# Patient Record
Sex: Female | Born: 1960 | Race: White | Hispanic: No | Marital: Married | Smoking: Current every day smoker
Health system: Southern US, Community
[De-identification: ages and names within clinical notes are randomized; demographics above are authoritative.]

## PROBLEM LIST (undated history)

## (undated) DIAGNOSIS — G8929 Other chronic pain: Secondary | ICD-10-CM

## (undated) DIAGNOSIS — M1990 Unspecified osteoarthritis, unspecified site: Secondary | ICD-10-CM

## (undated) DIAGNOSIS — K759 Inflammatory liver disease, unspecified: Secondary | ICD-10-CM

## (undated) DIAGNOSIS — F419 Anxiety disorder, unspecified: Secondary | ICD-10-CM

---

## 2007-11-14 ENCOUNTER — Ambulatory Visit: Payer: Self-pay | Admitting: Unknown Physician Specialty

## 2010-01-07 ENCOUNTER — Ambulatory Visit: Payer: Self-pay | Admitting: Unknown Physician Specialty

## 2011-02-23 ENCOUNTER — Ambulatory Visit: Payer: Self-pay | Admitting: Family Medicine

## 2011-03-30 ENCOUNTER — Ambulatory Visit: Payer: Self-pay | Admitting: Family Medicine

## 2011-12-04 ENCOUNTER — Ambulatory Visit: Payer: Self-pay | Admitting: Internal Medicine

## 2012-02-29 ENCOUNTER — Ambulatory Visit: Payer: Self-pay | Admitting: Unknown Physician Specialty

## 2012-03-06 ENCOUNTER — Ambulatory Visit: Payer: Self-pay | Admitting: Unknown Physician Specialty

## 2013-03-13 ENCOUNTER — Ambulatory Visit: Payer: Self-pay | Admitting: Family Medicine

## 2013-04-09 ENCOUNTER — Ambulatory Visit: Payer: Self-pay | Admitting: Unknown Physician Specialty

## 2014-04-04 ENCOUNTER — Ambulatory Visit: Payer: Self-pay | Admitting: Family Medicine

## 2014-04-04 LAB — CBC WITH DIFFERENTIAL/PLATELET
BASOS ABS: 0 10*3/uL (ref 0.0–0.1)
BASOS PCT: 0.3 %
EOS ABS: 0.1 10*3/uL (ref 0.0–0.7)
Eosinophil %: 1.6 %
HCT: 45.5 % (ref 35.0–47.0)
HGB: 15.2 g/dL (ref 12.0–16.0)
LYMPHS ABS: 0.9 10*3/uL — AB (ref 1.0–3.6)
Lymphocyte %: 12.4 %
MCH: 29.9 pg (ref 26.0–34.0)
MCHC: 33.5 g/dL (ref 32.0–36.0)
MCV: 89 fL (ref 80–100)
Monocyte #: 0.4 x10 3/mm (ref 0.2–0.9)
Monocyte %: 6 %
Neutrophil #: 5.7 10*3/uL (ref 1.4–6.5)
Neutrophil %: 79.7 %
Platelet: 171 10*3/uL (ref 150–440)
RBC: 5.09 10*6/uL (ref 3.80–5.20)
RDW: 14 % (ref 11.5–14.5)
WBC: 7.2 10*3/uL (ref 3.6–11.0)

## 2014-04-04 LAB — COMPREHENSIVE METABOLIC PANEL
ALBUMIN: 3.6 g/dL (ref 3.4–5.0)
ALT: 36 U/L
Alkaline Phosphatase: 73 U/L
Anion Gap: 7 (ref 7–16)
BILIRUBIN TOTAL: 0.4 mg/dL (ref 0.2–1.0)
BUN: 14 mg/dL (ref 7–18)
Calcium, Total: 9.3 mg/dL (ref 8.5–10.1)
Chloride: 104 mmol/L (ref 98–107)
Co2: 29 mmol/L (ref 21–32)
Creatinine: 0.82 mg/dL (ref 0.60–1.30)
Glucose: 91 mg/dL (ref 65–99)
Osmolality: 279 (ref 275–301)
POTASSIUM: 3.6 mmol/L (ref 3.5–5.1)
SGOT(AST): 17 U/L (ref 15–37)
SODIUM: 140 mmol/L (ref 136–145)
Total Protein: 7.4 g/dL (ref 6.4–8.2)

## 2014-04-04 LAB — LIPASE, BLOOD: Lipase: 61 U/L — ABNORMAL LOW (ref 73–393)

## 2014-04-16 ENCOUNTER — Ambulatory Visit: Payer: Self-pay

## 2014-04-16 ENCOUNTER — Ambulatory Visit: Payer: Self-pay | Admitting: Unknown Physician Specialty

## 2015-07-16 ENCOUNTER — Other Ambulatory Visit: Payer: Self-pay | Admitting: Unknown Physician Specialty

## 2015-07-16 DIAGNOSIS — Z1231 Encounter for screening mammogram for malignant neoplasm of breast: Secondary | ICD-10-CM

## 2015-08-04 ENCOUNTER — Ambulatory Visit
Admission: RE | Admit: 2015-08-04 | Discharge: 2015-08-04 | Disposition: A | Discharge status: Home / Self Care | Payer: 59 | Source: Ambulatory Visit | Attending: Unknown Physician Specialty | Admitting: Unknown Physician Specialty

## 2015-08-04 DIAGNOSIS — Z1231 Encounter for screening mammogram for malignant neoplasm of breast: Secondary | ICD-10-CM | POA: Diagnosis not present

## 2016-10-10 ENCOUNTER — Other Ambulatory Visit: Payer: Self-pay | Admitting: Unknown Physician Specialty

## 2016-10-10 DIAGNOSIS — Z1231 Encounter for screening mammogram for malignant neoplasm of breast: Secondary | ICD-10-CM

## 2016-10-19 ENCOUNTER — Ambulatory Visit
Admission: RE | Admit: 2016-10-19 | Discharge: 2016-10-19 | Disposition: A | Discharge status: Home / Self Care | Payer: BLUE CROSS/BLUE SHIELD | Source: Ambulatory Visit | Attending: Unknown Physician Specialty | Admitting: Unknown Physician Specialty

## 2016-10-19 DIAGNOSIS — Z1231 Encounter for screening mammogram for malignant neoplasm of breast: Secondary | ICD-10-CM | POA: Diagnosis not present

## 2018-01-11 ENCOUNTER — Ambulatory Visit
Admission: EM | Admit: 2018-01-11 | Discharge: 2018-01-11 | Disposition: A | Discharge status: Home / Self Care | Payer: BLUE CROSS/BLUE SHIELD | Attending: Family Medicine | Admitting: Family Medicine

## 2018-01-11 DIAGNOSIS — N3001 Acute cystitis with hematuria: Secondary | ICD-10-CM | POA: Diagnosis not present

## 2018-01-11 DIAGNOSIS — R300 Dysuria: Secondary | ICD-10-CM | POA: Diagnosis not present

## 2018-01-11 DIAGNOSIS — R350 Frequency of micturition: Secondary | ICD-10-CM

## 2018-01-11 DIAGNOSIS — R3915 Urgency of urination: Secondary | ICD-10-CM

## 2018-01-11 LAB — URINALYSIS, COMPLETE (UACMP) WITH MICROSCOPIC
Bilirubin Urine: NEGATIVE
GLUCOSE, UA: NEGATIVE mg/dL
Ketones, ur: NEGATIVE mg/dL
Nitrite: NEGATIVE
PH: 5.5 (ref 5.0–8.0)
Protein, ur: NEGATIVE mg/dL
SPECIFIC GRAVITY, URINE: 1.02 (ref 1.005–1.030)

## 2018-01-11 MED ORDER — CEPHALEXIN 500 MG PO CAPS: 500.0000 mg | ORAL_CAPSULE | Freq: Two times a day (BID) | ORAL | 0 refills | Status: DC

## 2018-01-11 NOTE — ED Triage Notes (Signed)
Pt here for dysuria, frequency and pelvic pressure for 2 days. Did take ibuprofen without relief.

## 2018-01-11 NOTE — ED Provider Notes (Signed)
[facility]  CSN: [id number] Arrival date & time: 01/11/18  1522  History   Chief Complaint Chief Complaint  Patient presents with  . Urinary Tract Infection   HPI  57 year old female presents with urinary symptoms.  Started on Tuesday.  She reports suprapubic pressure, dysuria, urinary frequency, and urgency.  She has taken ibuprofen and Azo without improvement.  No fevers or chills.  No back pain or flank pain.  No reports of nausea vomiting.  No known exacerbating factors.  No other associated symptoms.  No other complaints.  PMH: Hepatitis B infection  suppressed on Tenofovir  HSV (herpes simplex virus) infection    Tobacco use    Dyslipidemia, unspecified    Depression    Obesity (BMI 30-39.9), unspecified    Perimenopausal    DDD (degenerative disc disease), lumbar 07/27/2011   Chronic pain disorder 08/24/2011   Pap smear abnormality of cervix with ASCUS favoring benign    Chronic pain     Surgical Hx: LIVER BIOPSY     OB History   None    Home Medications    Prior to Admission medications   Medication Sig Start Date End Date Taking? Authorizing Provider  acetic acid-hydrocortisone (VOSOL-HC) OTIC solution Place in ear(s). 12/19/16  Yes [provider]  escitalopram (LEXAPRO) 20 MG tablet TK 1 T PO ONCE D. 01/07/18  Yes [provider]  gabapentin (NEURONTIN) 100 MG capsule TK 1 TO 3 CS PO QHS 12/04/17  Yes [provider]  ibuprofen (ADVIL,MOTRIN) 800 MG tablet TAKE 1 TABLET(800 MG) BY MOUTH EVERY 8 HOURS AS NEEDED FOR PAIN 01/09/18  Yes [provider]  OXYCONTIN 15 MG 12 hr tablet TK 1 T PO Q 12 H 12/16/17  Yes [provider]  tenofovir (VIREAD) 300 MG tablet  01/08/18  Yes [provider]  valACYclovir (VALTREX) 1000 MG tablet Take by mouth. 12/19/16  Yes [provider]  cephALEXin (KEFLEX) 500 MG capsule Take 1 capsule (500 mg total) by mouth 2 (two) times daily. 01/11/18    Tommie Sams, DO   Family History Family History  Problem Relation Age of Onset  . Breast cancer Neg Hx    Social History Social History   Tobacco Use  . Smoking status: Current Every Day Smoker  . Smokeless tobacco: Never Used  Substance Use Topics  . Alcohol use: Not on file  . Drug use: Not on file   Allergies   Patient has no known allergies.  Review of Systems Review of Systems  Constitutional: Negative.   Gastrointestinal:       Suprapubic pain.  Genitourinary: Positive for dysuria, frequency and urgency. Negative for flank pain.    Physical Exam Triage Vital Signs ED Triage Vitals  Enc Vitals Group     BP 01/11/18 1531 128/62     Pulse Rate 01/11/18 1531 82     Resp 01/11/18 1531 18     Temp 01/11/18 1531 98.2 F (36.8 C)     Temp Source 01/11/18 1531 Oral     SpO2 01/11/18 1531 100 %     Weight 01/11/18 1533 160 lb (72.6 kg)     Height --      Head Circumference --      Peak Flow --      Pain Score 01/11/18 1533 6     Pain Loc --      Pain Edu? --      Excl. in GC? --  Updated Vital Signs BP 128/62 (BP Location: Left Arm)   Pulse 82   Temp 98.2 F (36.8 C) (Oral)   Resp 18   Wt 160 lb (72.6 kg)   SpO2 100%     Physical Exam  Constitutional: She is oriented to person, place, and time. She appears well-developed. No distress.  Cardiovascular: Normal rate and regular rhythm.  Pulmonary/Chest: Effort normal and breath sounds normal. She has no wheezes. She has no rales.  Abdominal:  Soft, nondistended.  Tender to palpation in the suprapubic region.  Neurological: She is alert and oriented to person, place, and time.  Psychiatric: She has a normal mood and affect. Her behavior is normal.  Nursing note and vitals reviewed.  UC Treatments / Results  Labs (all labs ordered are listed, but only abnormal results are displayed) Labs Reviewed  URINALYSIS, COMPLETE (UACMP) WITH MICROSCOPIC - Abnormal; Notable for the following components:       Result Value   Hgb urine dipstick MODERATE (*)    Leukocytes, UA TRACE (*)    Bacteria, UA RARE (*)    All other components within normal limits  URINE CULTURE    EKG None  Radiology No results found.  Procedures Procedures (including critical care time)  Medications Ordered in UC Medications - No data to display  Initial Impression / Assessment and Plan / UC Course  I have reviewed the triage vital signs and the nursing notes.  Pertinent labs & imaging results that were available during my care of the patient were reviewed by me and considered in my medical decision making (see chart for details).    57 year old female presents with UTI.  Sending culture.  Started on Keflex.  Final Clinical Impressions(s) / UC Diagnoses   Final diagnoses:  Acute cystitis with hematuria   Discharge Instructions   None    ED Prescriptions    Medication Sig Dispense Auth. Provider   cephALEXin (KEFLEX) 500 MG capsule Take 1 capsule (500 mg total) by mouth 2 (two) times daily. 10 capsule Tommie Sams, DO     Controlled Substance Prescriptions  Controlled Substance Registry consulted? Not Applicable   Tommie Sams, DO 01/11/18 1616

## 2018-01-13 LAB — URINE CULTURE: CULTURE: NO GROWTH

## 2018-07-23 ENCOUNTER — Other Ambulatory Visit: Payer: Self-pay | Admitting: Unknown Physician Specialty

## 2018-07-23 DIAGNOSIS — Z1231 Encounter for screening mammogram for malignant neoplasm of breast: Secondary | ICD-10-CM

## 2018-07-31 ENCOUNTER — Encounter (INDEPENDENT_AMBULATORY_CARE_PROVIDER_SITE_OTHER): Payer: Self-pay

## 2018-07-31 ENCOUNTER — Ambulatory Visit
Admission: RE | Admit: 2018-07-31 | Discharge: 2018-07-31 | Disposition: A | Discharge status: Home / Self Care | Payer: BLUE CROSS/BLUE SHIELD | Source: Ambulatory Visit | Attending: Unknown Physician Specialty | Admitting: Unknown Physician Specialty

## 2018-07-31 DIAGNOSIS — Z1231 Encounter for screening mammogram for malignant neoplasm of breast: Secondary | ICD-10-CM | POA: Insufficient documentation

## 2018-09-10 ENCOUNTER — Other Ambulatory Visit: Payer: Self-pay | Admitting: Ophthalmology

## 2018-09-10 DIAGNOSIS — S0511XA Contusion of eyeball and orbital tissues, right eye, initial encounter: Secondary | ICD-10-CM

## 2018-09-12 ENCOUNTER — Encounter (INDEPENDENT_AMBULATORY_CARE_PROVIDER_SITE_OTHER): Payer: Self-pay

## 2018-09-12 ENCOUNTER — Ambulatory Visit
Admission: RE | Admit: 2018-09-12 | Discharge: 2018-09-12 | Disposition: A | Discharge status: Home / Self Care | Payer: BLUE CROSS/BLUE SHIELD | Source: Ambulatory Visit | Attending: Ophthalmology | Admitting: Ophthalmology

## 2018-09-12 DIAGNOSIS — S0511XA Contusion of eyeball and orbital tissues, right eye, initial encounter: Secondary | ICD-10-CM | POA: Diagnosis present

## 2018-09-17 ENCOUNTER — Other Ambulatory Visit: Payer: Self-pay

## 2018-09-17 ENCOUNTER — Encounter
Admission: RE | Admit: 2018-09-17 | Discharge: 2018-09-17 | Disposition: A | Discharge status: Home / Self Care | Payer: BLUE CROSS/BLUE SHIELD | Source: Ambulatory Visit | Attending: Otolaryngology | Admitting: Otolaryngology

## 2018-09-17 DIAGNOSIS — Z01812 Encounter for preprocedural laboratory examination: Secondary | ICD-10-CM | POA: Diagnosis not present

## 2018-09-17 HISTORY — DX: Unspecified osteoarthritis, unspecified site: M19.90

## 2018-09-17 HISTORY — DX: Other chronic pain: G89.29

## 2018-09-17 HISTORY — DX: Inflammatory liver disease, unspecified: K75.9

## 2018-09-17 HISTORY — DX: Anxiety disorder, unspecified: F41.9

## 2018-09-17 LAB — HEMOGLOBIN: Hemoglobin: 13.6 g/dL (ref 12.0–15.0)

## 2018-09-17 NOTE — Patient Instructions (Signed)
Your procedure is scheduled on: Wed 09/19/2018 Report to [place]. To find out your arrival time please call [phone] between 1PM - 3PM on Tue 09/18/2018.  Remember: Instructions that are not followed completely may result in serious medical risk, up to and including death, or upon the discretion of your surgeon and anesthesiologist your surgery may need to be rescheduled.     _X__ 1. Do not eat food after midnight the night before your procedure.                 No gum chewing or hard candies. You may drink clear liquids up to 2 hours                 before you are scheduled to arrive for your surgery- DO not drink clear                 liquids within 2 hours of the start of your surgery.                 Clear Liquids include:  water, apple juice without pulp, clear carbohydrate                 drink such as Clearfast or Gatorade, Black Coffee or Tea (Do not add                 anything to coffee or tea).  __X__2.  On the morning of surgery brush your teeth with toothpaste and water, you                 may rinse your mouth with mouthwash if you wish.  Do not swallow any              toothpaste of mouthwash.     _X__ 3.  No Alcohol for 24 hours before or after surgery.   _X__ 4.  Do Not Smoke or use e-cigarettes For 24 Hours Prior to Your Surgery.                 Do not use any chewable tobacco products for at least 6 hours prior to                 surgery.  ____  5.  Bring all medications with you on the day of surgery if instructed.   __X__  6.  Notify your doctor if there is any change in your medical condition      (cold, fever, infections).     Do not wear jewelry, make-up, hairpins, clips or nail polish. Do not wear lotions, powders, or perfumes.  Do not shave 48 hours prior to surgery. Men may shave face and neck. Do not bring valuables to the hospital.    [facility] is not responsible for any belongings or  valuables.  Contacts, dentures/partials or body piercings may not be worn into surgery. Bring a case for your contacts, glasses or hearing aids, a denture cup will be supplied. Leave your suitcase in the car. After surgery it may be brought to your room. For patients admitted to the hospital, discharge time is determined by your treatment team.   Patients discharged the day of surgery will not be allowed to drive home.   Please read over the following fact sheets that you were given:   MRSA Information  __X__ Take these medicines the morning of surgery with A SIP OF WATER:  1. escitalopram (LEXAPRO)  2. OXYCONTIN 15 MG   3. tenofovir (VIREAD)   4. valACYclovir (VALTREX) if needed  5.  6.  ____ Fleet Enema (as directed)   ____ Use CHG Soap/SAGE wipes as directed  ____ Use inhalers on the day of surgery  ____ Stop metformin/Janumet/Farxiga 2 days prior to surgery    ____ Take 1/2 of usual insulin dose the night before surgery. No insulin the morning          of surgery.   ____ Stop Blood Thinners Coumadin/Plavix/Xarelto/Pleta/Pradaxa/Eliquis/Effient/Aspirin  on   Or contact your Surgeon, Cardiologist or Medical Doctor regarding  ability to stop your blood thinners  __X__ Stop Anti-inflammatories 7 days before surgery such as Advil, Ibuprofen, Motrin,  BC or Goodies Powder, Naprosyn, Naproxen, Aleve, Aspirin    __X__ Stop all herbal supplements, fish oil or vitamin E until after surgery.    ____ Bring C-Pap to the hospital.

## 2018-09-18 ENCOUNTER — Encounter: Payer: Self-pay | Admitting: Anesthesiology

## 2018-09-19 ENCOUNTER — Other Ambulatory Visit: Payer: Self-pay

## 2018-09-19 ENCOUNTER — Ambulatory Visit: Payer: BLUE CROSS/BLUE SHIELD | Admitting: Anesthesiology

## 2018-09-19 ENCOUNTER — Encounter: Payer: Self-pay | Admitting: *Deleted

## 2018-09-19 ENCOUNTER — Ambulatory Visit
Admission: RE | Admit: 2018-09-19 | Discharge: 2018-09-19 | Disposition: A | Discharge status: Home / Self Care | Payer: BLUE CROSS/BLUE SHIELD | Source: Ambulatory Visit | Attending: Otolaryngology | Admitting: Otolaryngology

## 2018-09-19 ENCOUNTER — Encounter
Admission: RE | Disposition: A | Discharge status: Home / Self Care | Payer: Self-pay | Source: Ambulatory Visit | Attending: Otolaryngology

## 2018-09-19 DIAGNOSIS — S0285XA Fracture of orbit, unspecified, initial encounter for closed fracture: Secondary | ICD-10-CM | POA: Diagnosis present

## 2018-09-19 DIAGNOSIS — Z880 Allergy status to penicillin: Secondary | ICD-10-CM | POA: Diagnosis not present

## 2018-09-19 DIAGNOSIS — S0230XA Fracture of orbital floor, unspecified side, initial encounter for closed fracture: Secondary | ICD-10-CM

## 2018-09-19 DIAGNOSIS — F419 Anxiety disorder, unspecified: Secondary | ICD-10-CM | POA: Insufficient documentation

## 2018-09-19 DIAGNOSIS — Z885 Allergy status to narcotic agent status: Secondary | ICD-10-CM | POA: Insufficient documentation

## 2018-09-19 DIAGNOSIS — F17210 Nicotine dependence, cigarettes, uncomplicated: Secondary | ICD-10-CM | POA: Diagnosis not present

## 2018-09-19 DIAGNOSIS — W541XXA Struck by dog, initial encounter: Secondary | ICD-10-CM | POA: Insufficient documentation

## 2018-09-19 DIAGNOSIS — S0231XA Fracture of orbital floor, right side, initial encounter for closed fracture: Secondary | ICD-10-CM | POA: Insufficient documentation

## 2018-09-19 DIAGNOSIS — Z79899 Other long term (current) drug therapy: Secondary | ICD-10-CM | POA: Diagnosis not present

## 2018-09-19 HISTORY — PX: ORIF ORBITAL FRACTURE: SHX5312

## 2018-09-19 SURGERY — OPEN REDUCTION INTERNAL FIXATION (ORIF) ORBITAL FRACTURE
Anesthesia: General | Laterality: Right

## 2018-09-19 MED ORDER — MIDAZOLAM HCL 2 MG/2ML IJ SOLN
INTRAMUSCULAR | Status: DC | PRN
  Administered 2018-09-19: 2 mg via INTRAVENOUS

## 2018-09-19 MED ORDER — MIDAZOLAM HCL 2 MG/2ML IJ SOLN
INTRAMUSCULAR | Status: AC
  Filled 2018-09-19: qty 2

## 2018-09-19 MED ORDER — PHENYLEPHRINE HCL 10 MG/ML IJ SOLN
INTRAMUSCULAR | Status: DC | PRN
  Administered 2018-09-19: 100 ug via INTRAVENOUS
  Administered 2018-09-19 (×2): 200 ug via INTRAVENOUS
  Administered 2018-09-19: 100 ug via INTRAVENOUS

## 2018-09-19 MED ORDER — GABAPENTIN 100 MG PO CAPS: 200.0000 mg | ORAL_CAPSULE | Freq: Three times a day (TID) | ORAL | 11 refills | Status: AC

## 2018-09-19 MED ORDER — DEXAMETHASONE SODIUM PHOSPHATE 10 MG/ML IJ SOLN
INTRAMUSCULAR | Status: DC | PRN
  Administered 2018-09-19: 10 mg via INTRAVENOUS

## 2018-09-19 MED ORDER — PROPOFOL 10 MG/ML IV BOLUS
INTRAVENOUS | Status: DC | PRN
  Administered 2018-09-19: 140 mg via INTRAVENOUS

## 2018-09-19 MED ORDER — ROCURONIUM BROMIDE 50 MG/5ML IV SOLN
INTRAVENOUS | Status: AC
  Filled 2018-09-19: qty 1

## 2018-09-19 MED ORDER — ROCURONIUM BROMIDE 100 MG/10ML IV SOLN
INTRAVENOUS | Status: DC | PRN
  Administered 2018-09-19: 20 mg via INTRAVENOUS
  Administered 2018-09-19: 15 mg via INTRAVENOUS
  Administered 2018-09-19: 5 mg via INTRAVENOUS

## 2018-09-19 MED ORDER — SUCCINYLCHOLINE CHLORIDE 20 MG/ML IJ SOLN
INTRAMUSCULAR | Status: DC | PRN
  Administered 2018-09-19: 100 mg via INTRAVENOUS

## 2018-09-19 MED ORDER — NEOMYCIN-POLYMYXIN-DEXAMETH 3.5-10000-0.1 OP OINT
TOPICAL_OINTMENT | OPHTHALMIC | Status: AC
  Filled 2018-09-19: qty 3.5

## 2018-09-19 MED ORDER — LIDOCAINE-EPINEPHRINE 1 %-1:100000 IJ SOLN
INTRAMUSCULAR | Status: DC | PRN
  Administered 2018-09-19: 2 mL

## 2018-09-19 MED ORDER — FAMOTIDINE 20 MG PO TABS
20.0000 mg | ORAL_TABLET | Freq: Once | ORAL | Status: AC
  Administered 2018-09-19: 20 mg via ORAL

## 2018-09-19 MED ORDER — GLYCOPYRROLATE 0.2 MG/ML IJ SOLN
INTRAMUSCULAR | Status: DC | PRN
  Administered 2018-09-19: 0.2 mg via INTRAVENOUS

## 2018-09-19 MED ORDER — SUGAMMADEX SODIUM 200 MG/2ML IV SOLN
INTRAVENOUS | Status: AC
  Filled 2018-09-19: qty 2

## 2018-09-19 MED ORDER — PROPOFOL 10 MG/ML IV BOLUS
INTRAVENOUS | Status: AC
  Filled 2018-09-19: qty 20

## 2018-09-19 MED ORDER — OXYCODONE-ACETAMINOPHEN 5-325 MG PO TABS
1.0000 | ORAL_TABLET | Freq: Four times a day (QID) | ORAL | Status: DC | PRN
  Administered 2018-09-19: 1 via ORAL

## 2018-09-19 MED ORDER — FENTANYL CITRATE (PF) 100 MCG/2ML IJ SOLN
INTRAMUSCULAR | Status: AC
  Filled 2018-09-19: qty 2

## 2018-09-19 MED ORDER — HYDROMORPHONE HCL 1 MG/ML IJ SOLN
INTRAMUSCULAR | Status: AC
  Filled 2018-09-19: qty 1

## 2018-09-19 MED ORDER — ONDANSETRON HCL 4 MG/2ML IJ SOLN
INTRAMUSCULAR | Status: AC
  Filled 2018-09-19: qty 2

## 2018-09-19 MED ORDER — FENTANYL CITRATE (PF) 100 MCG/2ML IJ SOLN
INTRAMUSCULAR | Status: DC | PRN
  Administered 2018-09-19 (×4): 50 ug via INTRAVENOUS

## 2018-09-19 MED ORDER — ONDANSETRON HCL 4 MG/2ML IJ SOLN
INTRAMUSCULAR | Status: DC | PRN
  Administered 2018-09-19: 4 mg via INTRAVENOUS

## 2018-09-19 MED ORDER — ARTIFICIAL TEARS OPHTHALMIC OINT
TOPICAL_OINTMENT | OPHTHALMIC | Status: AC
  Filled 2018-09-19: qty 3.5

## 2018-09-19 MED ORDER — LIDOCAINE-EPINEPHRINE 1 %-1:100000 IJ SOLN
INTRAMUSCULAR | Status: AC
  Filled 2018-09-19: qty 1

## 2018-09-19 MED ORDER — DOXYCYCLINE HYCLATE 100 MG PO CAPS: ORAL_CAPSULE | ORAL | 0 refills | Status: DC

## 2018-09-19 MED ORDER — HYDROMORPHONE HCL 1 MG/ML IJ SOLN
INTRAMUSCULAR | Status: AC
  Administered 2018-09-19: 0.5 mg via INTRAVENOUS
  Filled 2018-09-19: qty 1

## 2018-09-19 MED ORDER — HYDROMORPHONE HCL 1 MG/ML IJ SOLN
0.5000 mg | INTRAMUSCULAR | Status: AC | PRN
  Administered 2018-09-19 (×4): 0.5 mg via INTRAVENOUS

## 2018-09-19 MED ORDER — DEXAMETHASONE SODIUM PHOSPHATE 10 MG/ML IJ SOLN
INTRAMUSCULAR | Status: AC
  Filled 2018-09-19: qty 1

## 2018-09-19 MED ORDER — EPHEDRINE SULFATE 50 MG/ML IJ SOLN
INTRAMUSCULAR | Status: AC
  Filled 2018-09-19: qty 1

## 2018-09-19 MED ORDER — KETOROLAC TROMETHAMINE 30 MG/ML IJ SOLN
INTRAMUSCULAR | Status: DC | PRN
  Administered 2018-09-19: 30 mg via INTRAVENOUS

## 2018-09-19 MED ORDER — GLYCOPYRROLATE 0.2 MG/ML IJ SOLN
INTRAMUSCULAR | Status: AC
  Filled 2018-09-19: qty 1

## 2018-09-19 MED ORDER — OXYCODONE-ACETAMINOPHEN 5-325 MG PO TABS: 1.0000 | ORAL_TABLET | Freq: Four times a day (QID) | ORAL | 0 refills | Status: DC | PRN

## 2018-09-19 MED ORDER — ONDANSETRON HCL 4 MG/2ML IJ SOLN: 4.0000 mg | Freq: Once | INTRAMUSCULAR | Status: DC | PRN

## 2018-09-19 MED ORDER — LACTATED RINGERS IV SOLN
INTRAVENOUS | Status: DC
  Administered 2018-09-19: 07:00:00 via INTRAVENOUS

## 2018-09-19 MED ORDER — LIDOCAINE HCL (PF) 2 % IJ SOLN
INTRAMUSCULAR | Status: AC
  Filled 2018-09-19: qty 10

## 2018-09-19 MED ORDER — SUCCINYLCHOLINE CHLORIDE 20 MG/ML IJ SOLN
INTRAMUSCULAR | Status: AC
  Filled 2018-09-19: qty 1

## 2018-09-19 MED ORDER — SUGAMMADEX SODIUM 200 MG/2ML IV SOLN
INTRAVENOUS | Status: DC | PRN
  Administered 2018-09-19: 200 mg via INTRAVENOUS

## 2018-09-19 MED ORDER — BACITRACIN ZINC 500 UNIT/GM EX OINT
TOPICAL_OINTMENT | CUTANEOUS | Status: AC
  Filled 2018-09-19: qty 28.35

## 2018-09-19 MED ORDER — PHENYLEPHRINE HCL 10 MG/ML IJ SOLN
INTRAMUSCULAR | Status: AC
  Filled 2018-09-19: qty 1

## 2018-09-19 MED ORDER — HYDROMORPHONE HCL 1 MG/ML IJ SOLN
INTRAMUSCULAR | Status: DC | PRN
  Administered 2018-09-19: 0.5 mg via INTRAVENOUS

## 2018-09-19 MED ORDER — FAMOTIDINE 20 MG PO TABS
ORAL_TABLET | ORAL | Status: AC
  Administered 2018-09-19: 20 mg via ORAL
  Filled 2018-09-19: qty 1

## 2018-09-19 MED ORDER — LIDOCAINE HCL (CARDIAC) PF 100 MG/5ML IV SOSY
PREFILLED_SYRINGE | INTRAVENOUS | Status: DC | PRN
  Administered 2018-09-19: 100 mg via INTRAVENOUS

## 2018-09-19 MED ORDER — OXYCODONE-ACETAMINOPHEN 5-325 MG PO TABS
ORAL_TABLET | ORAL | Status: AC
  Filled 2018-09-19: qty 1

## 2018-09-19 SURGICAL SUPPLY — 28 items
APPLICATOR COTTON TIP 6 STRL (MISCELLANEOUS) ×1 IMPLANT
APPLICATOR COTTON TIP 6IN STRL (MISCELLANEOUS) ×3
BLADE SURG 15 STRL LF DISP TIS (BLADE) ×1 IMPLANT
BLADE SURG 15 STRL SS (BLADE) ×2
CANISTER SUCT 1200ML W/VALVE (MISCELLANEOUS) ×3 IMPLANT
COVER WAND RF STERILE (DRAPES) ×3 IMPLANT
DRAPE MAG INST 16X20 L/F (DRAPES) ×3 IMPLANT
ELECT REM PT RETURN 9FT ADLT (ELECTROSURGICAL) ×3
ELECTRODE REM PT RTRN 9FT ADLT (ELECTROSURGICAL) ×1 IMPLANT
GLOVE BIO SURGEON STRL SZ7.5 (GLOVE) ×3 IMPLANT
GOWN STRL REUS W/ TWL LRG LVL3 (GOWN DISPOSABLE) ×2 IMPLANT
GOWN STRL REUS W/TWL LRG LVL3 (GOWN DISPOSABLE) ×4
KIT TURNOVER KIT A (KITS) ×3 IMPLANT
LABEL OR SOLS (LABEL) ×3 IMPLANT
NS IRRIG 500ML POUR BTL (IV SOLUTION) ×3 IMPLANT
PACK HEAD/NECK (MISCELLANEOUS) ×3 IMPLANT
PLATE ORBITAL FLOOR 3D LG RT (Plate) ×3 IMPLANT
SCREW UPPERFACE 1.2X4M SLFDRIL (Screw) ×6 IMPLANT
SOL BAL SALT 15ML (MISCELLANEOUS) ×3
SOLUTION BAL SALT 15ML (MISCELLANEOUS) ×1 IMPLANT
SUCTION FRAZIER HANDLE 10FR (MISCELLANEOUS) ×2
SUCTION TUBE FRAZIER 10FR DISP (MISCELLANEOUS) ×1 IMPLANT
SUT CHROMIC 6 0 G 1 (SUTURE) ×3 IMPLANT
SUT PLAIN GUT FAST 5-0 (SUTURE) ×3 IMPLANT
SUT SILK 3-0 (SUTURE) ×2
SUT SILK 3-0 RB1 30XBRD (SUTURE) ×1
SUT VIC AB 5-0 PC1 18 (SUTURE) ×3 IMPLANT
SUTURE SILK 3-0 RB1 30XBRD (SUTURE) ×1 IMPLANT

## 2018-09-19 NOTE — Discharge Instructions (Signed)
AMBULATORY SURGERY  °DISCHARGE INSTRUCTIONS ° ° °1) The drugs that you were given will stay in your system until tomorrow so for the next 24 hours you should not: ° °A) Drive an automobile °B) Make any legal decisions °C) Drink any alcoholic beverage ° ° °2) You may resume regular meals tomorrow.  Today it is better to start with liquids and gradually work up to solid foods. ° °You may eat anything you prefer, but it is better to start with liquids, then soup and crackers, and gradually work up to solid foods. ° ° °3) Please notify your doctor immediately if you have any unusual bleeding, trouble breathing, redness and pain at the surgery site, drainage, fever, or pain not relieved by medication. ° °

## 2018-09-19 NOTE — Anesthesia Postprocedure Evaluation (Signed)
Anesthesia Post Note  Patient: Christina Roy  Procedure(s) Performed: OPEN REDUCTION INTERNAL FIXATION (ORIF) ORBITAL BLOW OUT FRACTURE (TRANSCONJUNCTIVAL APPROACH) (Right )  Patient location during evaluation: PACU Anesthesia Type: General Level of consciousness: awake and alert Pain management: pain level controlled Vital Signs Assessment: post-procedure vital signs reviewed and stable Respiratory status: spontaneous breathing, nonlabored ventilation, respiratory function stable and patient connected to nasal cannula oxygen Cardiovascular status: blood pressure returned to baseline and stable Postop Assessment: no apparent nausea or vomiting Anesthetic complications: no     Last Vitals:  Vitals:   09/19/18 1303 09/19/18 1347  BP: 134/62 (!) 141/67  Pulse: 72 74  Resp: 16   Temp:    SpO2: 98% 100%    Last Pain:  Vitals:   09/19/18 1410  TempSrc:   PainSc: 5                  Johnesha Acheampong S

## 2018-09-19 NOTE — Anesthesia Preprocedure Evaluation (Addendum)
Anesthesia Evaluation  Patient identified by MRN, date of birth, ID band Patient awake    Reviewed: Allergy & Precautions, NPO status , Patient's Chart, lab work & pertinent test results, reviewed documented beta blocker date and time   Airway Mallampati: II  TM Distance: >3 FB     Dental  (+) Chipped, Upper Dentures   Pulmonary Current Smoker,           Cardiovascular      Neuro/Psych Anxiety    GI/Hepatic (+) Hepatitis -  Endo/Other    Renal/GU      Musculoskeletal  (+) Arthritis ,   Abdominal   Peds  Hematology   Anesthesia Other Findings Pt feels loopy and or dizzy when she takes narcotics when she has to work. 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 This is not an allergy. She cannot take narcs and work a full time shift.  Reproductive/Obstetrics                            Anesthesia Physical Anesthesia Plan  ASA: III  Anesthesia Plan: General   Post-op Pain Management:    Induction: Intravenous  PONV Risk Score and Plan:   Airway Management Planned: Oral ETT  Additional Equipment:   Intra-op Plan:   Post-operative Plan:   Informed Consent: I have reviewed the patients History and Physical, chart, labs and discussed the procedure including the risks, benefits and alternatives for the proposed anesthesia with the patient or authorized representative who has indicated his/her understanding and acceptance.       Plan Discussed with: CRNA  Anesthesia Plan Comments:         Anesthesia Quick Evaluation

## 2018-09-19 NOTE — Anesthesia Post-op Follow-up Note (Signed)
Anesthesia QCDR form completed.        

## 2018-09-19 NOTE — Anesthesia Procedure Notes (Signed)
Procedure Name: Intubation Date/Time: 09/19/2018 7:44 AM Performed by: Ginger Carne, CRNA Pre-anesthesia Checklist: Patient identified, Emergency Drugs available, Suction available, Patient being monitored and Timeout performed Patient Re-evaluated:Patient Re-evaluated prior to induction Oxygen Delivery Method: Circle system utilized Preoxygenation: Pre-oxygenation with 100% oxygen Induction Type: IV induction Ventilation: Mask ventilation without difficulty Laryngoscope Size: Miller and 2 Grade View: Grade I Tube type: Oral Rae Tube size: 7.0 mm Number of attempts: 1 Airway Equipment and Method: Stylet and Oral airway Placement Confirmation: ETT inserted through vocal cords under direct vision,  positive ETCO2 and breath sounds checked- equal and bilateral Secured at: 20 cm Tube secured with: Tape Dental Injury: Teeth and Oropharynx as per pre-operative assessment

## 2018-09-19 NOTE — H&P (Signed)
History and physical reviewed and will be scanned in later. No change in medical status reported by the patient or family, appears stable for surgery. All questions regarding the procedure answered, and patient (or family if a child) expressed understanding of the procedure. ? ?Christina Roy S [name] ?@TODAY@ ?

## 2018-09-19 NOTE — Op Note (Signed)
09/19/2018  10:54 AM    Christina Roy  [id number]   Pre-Op Diagnosis:  RIGHT ORBITAL FRACTURE  Post-op Diagnosis: RIGHT ORBITAL FRACTURE  Procedure:   Open reduction and internal fixation of right orbital blowout fracture  Surgeon:  Sandi Mealy  Anesthesia:  General endotracheal  EBL: Less than 10 cc  Complications:  None  Findings: Inferomedial blowout fracture of the right orbital floor, medial to the infraorbital canal with herniated fat.  Defect was repaired with a 3D titanium mesh plate #[id number] with 1 screw placed at the inferior orbital rim  Procedure: After the patient was identified in holding and the procedure was reviewed.  The patient was taken to the operating room and with the patient in a comfortable supine position,  general endotracheal anesthesia was induced without difficulty.  A proper time-out was performed, confirming the operative site and procedure.  1% lidocaine with epinephrine 1-100,000 was injected into the region of the lateral canthus and into the conjunctive of the lower lid just below the tarsal plate.  Patient was then prepped and draped the usual sterile fashion.  A 15 blade was then used to incise the conjunctiva just below the tarsal plate.  Tenotomies were then used to dissect between the orbital septum and the orbicularis oculi muscle, taking care to cornea, which was frequently re-wetted with saline solution.  A 3-0 silk suture was used to retract the conjunctiva up over the cornea for the remainder of the case to provide corneal protection.  A second 3-0 silk was used to retract the lower lid.  Dissection proceeded down to the orbital rim which was incised initially with a 15 blade and then with the needlepoint Bovie, incising through the periosteum.  The periosteum was then elevated into the orbital floor dissecting posteriorly and medially until the fracture was located medial to the region of the infraorbital canal.  Dissection proceeded  along the fracture line, reducing the prolapsed orbital fat back into the orbit from around the fractured bony segment.  There was a larger bony segment medially and a smaller segment more laterally.  These could be temporarily reduced upwards but there was really nothing to secure them without use of a mesh implant.  A 3D titanium mesh plate with a medial curvature was used to reconstruct the defect, trimming the plate for a better fit.  The plate was placed into good position, taking care to reduce the fat so that it was sitting on top of the plate appropriately.  Two 4 mm self drilling screws were placed to secure the plate, one at the edge of the orbital rim, and another just over the edge of the maxilla.  The orbital rim screw was sitting a bit high and easily palpable so it was removed but the plate was well secured by the single screw.  Forced duction testing confirmed adequate mobility of the globe.  The eye was once again rinsed with sterile saline and the conjunctival incision closed with 6-0 Chromic Gut suture in a running stitch with the sutures buried.  Forced duction testing was again utilized to make sure the globe remained mobile.  A small ribbon of Maxitrol ophthalmic ointment was placed on the lower lid.  The patient was then returned to the anesthesiologist in good condition for awakening. The patient was awakened and taken to the recovery room in good condition.   Disposition:   PACU then discharge home  Plan: Discharge home on antibiotics and topical ophthalmic ointment.  Ice  to eye as needed.  Postoperative pain management was discussed with her primary care physician Dr. Epimenio Foot.  We will increase her gabapentin to 200 mg 3 times daily and provide Percocet 5/325 1 every 6 hours as needed for breakthrough pain.  She will also continue with her current dose of OxyContin 15 mg p.o. twice daily.  Follow-up in 1 week.  Sandi Mealy 09/19/2018 10:54 AM

## 2018-09-19 NOTE — Transfer of Care (Signed)
Immediate Anesthesia Transfer of Care Note  Patient: Christina Roy  Procedure(s) Performed: OPEN REDUCTION INTERNAL FIXATION (ORIF) ORBITAL BLOW OUT FRACTURE (TRANSCONJUNCTIVAL APPROACH) (Right )  Patient Location: PACU  Anesthesia Type:General  Level of Consciousness: sedated  Airway & Oxygen Therapy: Patient Spontanous Breathing and Patient connected to face mask oxygen  Post-op Assessment: Report given to RN and Post -op Vital signs reviewed and stable  Post vital signs: Reviewed and stable  Last Vitals:  Vitals Value Taken Time  BP 166/64 09/19/2018 11:06 AM  Temp 37.2 C 09/19/2018 11:06 AM  Pulse 88 09/19/2018 11:10 AM  Resp 13 09/19/2018 11:10 AM  SpO2 99 % 09/19/2018 11:10 AM  Vitals shown include unvalidated device data.  Last Pain:  Vitals:   09/19/18 1106  TempSrc:   PainSc: Asleep         Complications: No apparent anesthesia complications

## 2018-09-19 NOTE — Progress Notes (Signed)
Right hand swelling and redness. Notified Dr. Maisie Fus (anesthesia), which will follow up shortly. Instructed patient to elevate the extremity and given an ice pack to apply as needed for swelling and discomfort.

## 2018-09-19 NOTE — Care Management (Signed)
Mild swelling in R hand where IVs were tried. L hand is ok. No swelling in feet. Lungs clear. I think the mild swelling will dissipate in a few days. May be d/c'd.

## 2018-09-20 ENCOUNTER — Encounter: Payer: Self-pay | Admitting: Otolaryngology

## 2018-10-02 ENCOUNTER — Other Ambulatory Visit: Payer: Self-pay

## 2018-10-02 ENCOUNTER — Ambulatory Visit
Admission: EM | Admit: 2018-10-02 | Discharge: 2018-10-02 | Disposition: A | Discharge status: Home / Self Care | Payer: BLUE CROSS/BLUE SHIELD | Attending: Family Medicine | Admitting: Family Medicine

## 2018-10-02 ENCOUNTER — Encounter: Payer: Self-pay | Admitting: Emergency Medicine

## 2018-10-02 DIAGNOSIS — N390 Urinary tract infection, site not specified: Secondary | ICD-10-CM

## 2018-10-02 LAB — URINALYSIS, COMPLETE (UACMP) WITH MICROSCOPIC
Bilirubin Urine: NEGATIVE
Glucose, UA: NEGATIVE mg/dL
Ketones, ur: NEGATIVE mg/dL
Nitrite: NEGATIVE
PH: 7 (ref 5.0–8.0)
Protein, ur: NEGATIVE mg/dL
SPECIFIC GRAVITY, URINE: 1.01 (ref 1.005–1.030)
Squamous Epithelial / HPF: NONE SEEN (ref 0–5)

## 2018-10-02 MED ORDER — PHENAZOPYRIDINE HCL 200 MG PO TABS: 200.0000 mg | ORAL_TABLET | Freq: Three times a day (TID) | ORAL | 0 refills | Status: DC

## 2018-10-02 MED ORDER — CEPHALEXIN 500 MG PO CAPS: 500.0000 mg | ORAL_CAPSULE | Freq: Two times a day (BID) | ORAL | 0 refills | Status: DC

## 2018-10-02 MED ORDER — FLUCONAZOLE 150 MG PO TABS: ORAL_TABLET | ORAL | 0 refills | Status: DC

## 2018-10-02 NOTE — ED Triage Notes (Signed)
Patient in today c/o dysuria and urinary frequency x today. Patient denies fever.

## 2018-10-02 NOTE — ED Provider Notes (Signed)
[facility]    CSN: [id number] Arrival date & time: 10/02/18  1709     History   Chief Complaint Chief Complaint  Patient presents with  . Dysuria  . Urinary Frequency    HPI Ermelinda DasLinda Marie Lyn is a 58 y.o. female.   HPI  58 year old female presents with dysuria and urinary frequency that started today.  Pressure in her lower abdomen.  Nuys any fever or chills.  She has had no nausea vomiting.  Denies vaginal irritation or discharge.     Past Medical History:  Diagnosis Date  . Anxiety   . Arthritis   . Chronic pain   . Hepatitis    Hep B treated w/ Viread    There are no active problems to display for this patient.   Past Surgical History:  Procedure Laterality Date  . ORIF ORBITAL FRACTURE Right 09/19/2018   Procedure: OPEN REDUCTION INTERNAL FIXATION (ORIF) ORBITAL BLOW OUT FRACTURE (TRANSCONJUNCTIVAL APPROACH);  Surgeon: Geanie LoganBennett, Paul, MD;  Location: [facility] ORS;  Service: ENT;  Laterality: Right;    OB History   No obstetric history on file.      Home Medications    Prior to Admission medications   Medication Sig Start Date End Date Taking? Authorizing Provider  acetic acid-hydrocortisone (VOSOL-HC) OTIC solution Place 3-4 drops into both ears 2 (two) times daily as needed (ear itching).  12/19/16  Yes [provider]  escitalopram (LEXAPRO) 20 MG tablet Take 20 mg by mouth daily.  01/07/18  Yes [provider]  gabapentin (NEURONTIN) 100 MG capsule Take 2 capsules (200 mg total) by mouth 3 (three) times daily. 09/19/18  Yes Geanie LoganBennett, Paul, MD  OXYCONTIN 15 MG 12 hr tablet Take 15 mg by mouth 2 (two) times daily.  12/16/17  Yes [provider]  tenofovir (VIREAD) 300 MG tablet Take 300 mg by mouth daily.  01/08/18  Yes [provider]  valACYclovir (VALTREX) 500 MG tablet Take 1,000 mg by mouth daily as needed (outbreaks).   Yes [provider]  cephALEXin (KEFLEX) 500 MG capsule Take 1 capsule (500 mg  total) by mouth 2 (two) times daily. 10/02/18   Lutricia Feiloemer, Damarie Schoolfield P, PA-C  famotidine (PEPCID) 20 MG tablet Take 20 mg by mouth 2 (two) times daily. 07/31/18   [provider]  fluconazole (DIFLUCAN) 150 MG tablet Take one tab for symptoms of yeast infection. Repeat x 1 in 72 hours. 10/02/18   Lutricia Feiloemer, Aedin Jeansonne P, PA-C  phenazopyridine (PYRIDIUM) 200 MG tablet Take 1 tablet (200 mg total) by mouth 3 (three) times daily. 10/02/18   Lutricia Feiloemer, Emile Kyllo P, PA-C    Family History Family History  Problem Relation Age of Onset  . Thyroid disease Mother   . Other Father        unknown medical history  . Breast cancer Neg Hx     Social History Social History   Tobacco Use  . Smoking status: Current Every Day Smoker    Packs/day: 0.50    Types: Cigarettes  . Smokeless tobacco: Never Used  Substance Use Topics  . Alcohol use: Not Currently  . Drug use: Never     Allergies   Bupropion; Demerol [meperidine hcl]; Fentanyl; Morphine; Penicillins; and Trazodone   Review of Systems Review of Systems  Constitutional: Positive for activity change. Negative for appetite change, chills, fatigue and fever.  Genitourinary: Positive for dysuria, frequency and urgency. Negative for flank pain, vaginal discharge and vaginal pain.  All other systems reviewed  and are negative.    Physical Exam Triage Vital Signs ED Triage Vitals  Enc Vitals Group     BP 10/02/18 1726 (!) 146/83     Pulse Rate 10/02/18 1726 77     Resp 10/02/18 1726 16     Temp 10/02/18 1726 98.2 F (36.8 C)     Temp Source 10/02/18 1726 Oral     SpO2 10/02/18 1726 100 %     Weight 10/02/18 1727 160 lb (72.6 kg)     Height 10/02/18 1727 5\' 2"  (1.575 m)     Head Circumference --      Peak Flow --      Pain Score 10/02/18 1726 9     Pain Loc --      Pain Edu? --      Excl. in [place]? --    No data found.  Updated Vital Signs BP (!) 146/83 (BP Location: Right Arm)   Pulse 77   Temp 98.2 F (36.8 C) (Oral)   Resp 16   Ht  5\' 2"  (1.575 m)   Wt 160 lb (72.6 kg)   SpO2 100%   BMI 29.26 kg/m   Visual Acuity Right Eye Distance:   Left Eye Distance:   Bilateral Distance:    Right Eye Near:   Left Eye Near:    Bilateral Near:     Physical Exam Vitals signs and nursing note reviewed.  Constitutional:      General: She is not in acute distress.    Appearance: Normal appearance. She is not ill-appearing, toxic-appearing or diaphoretic.  HENT:     Head: Normocephalic.     Nose: Nose normal.  Eyes:     General:        Right eye: No discharge.        Left eye: No discharge.     Conjunctiva/sclera: Conjunctivae normal.  Neck:     Musculoskeletal: Normal range of motion and neck supple.  Pulmonary:     Effort: Pulmonary effort is normal.     Breath sounds: Normal breath sounds.  Abdominal:     Tenderness: There is no right CVA tenderness or left CVA tenderness.  Musculoskeletal: Normal range of motion.  Lymphadenopathy:     Cervical: No cervical adenopathy.  Skin:    General: Skin is warm and dry.  Neurological:     General: No focal deficit present.     Mental Status: She is alert and oriented to person, place, and time.  Psychiatric:        Mood and Affect: Mood normal.        Behavior: Behavior normal.        Thought Content: Thought content normal.        Judgment: Judgment normal.      UC Treatments / Results  Labs (all labs ordered are listed, but only abnormal results are displayed) Labs Reviewed  URINALYSIS, COMPLETE (UACMP) WITH MICROSCOPIC - Abnormal; Notable for the following components:      Result Value   Color, Urine STRAW (*)    Hgb urine dipstick SMALL (*)    Leukocytes, UA SMALL (*)    Bacteria, UA RARE (*)    All other components within normal limits    EKG None  Radiology No results found.  Procedures Procedures (including critical care time)  Medications Ordered in UC Medications - No data to display  Initial Impression / Assessment and Plan / UC Course   I have reviewed the triage  vital signs and the nursing notes.  Pertinent labs & imaging results that were available during my care of the patient were reviewed by me and considered in my medical decision making (see chart for details).   Place the patient on Keflex and Pyridium.  Was given a prescription for Diflucan since she does have yeast infections following antibiotics.  Needs to drink more fluids.  Cultures and sensitivities will be available in 48 hours.   Final Clinical Impressions(s) / UC Diagnoses   Final diagnoses:  Lower urinary tract infectious disease   Discharge Instructions   None    ED Prescriptions    Medication Sig Dispense Auth. Provider   fluconazole (DIFLUCAN) 150 MG tablet Take one tab for symptoms of yeast infection. Repeat x 1 in 72 hours. 2 tablet Ovid Curdoemer, Anah Billard P, PA-C   cephALEXin (KEFLEX) 500 MG capsule Take 1 capsule (500 mg total) by mouth 2 (two) times daily. 10 capsule Ovid Curdoemer, Taysha Majewski P, PA-C   phenazopyridine (PYRIDIUM) 200 MG tablet Take 1 tablet (200 mg total) by mouth 3 (three) times daily. 6 tablet Lutricia Feiloemer, Essynce Munsch P, PA-C     Controlled Substance Prescriptions  Controlled Substance Registry consulted? Not Applicable   Lutricia FeilRoemer, Shreyansh Tiffany P, PA-C 10/02/18 1849

## 2018-10-05 LAB — URINE CULTURE: Culture: >=100000 — AB

## 2018-10-09 ENCOUNTER — Telehealth (HOSPITAL_COMMUNITY): Payer: Self-pay | Admitting: Emergency Medicine

## 2018-10-09 NOTE — Telephone Encounter (Signed)
Urine culture was positive for e coli and was given keflex at urgent care visit. Pt contacted and made aware, educated on completing antibiotic and to follow up if symptoms are persistent. Verbalized understanding.    

## 2019-07-29 ENCOUNTER — Other Ambulatory Visit: Payer: Self-pay

## 2019-07-29 ENCOUNTER — Ambulatory Visit
Admission: EM | Admit: 2019-07-29 | Discharge: 2019-07-29 | Disposition: A | Discharge status: Home / Self Care | Payer: BLUE CROSS/BLUE SHIELD | Attending: Family Medicine | Admitting: Family Medicine

## 2019-07-29 DIAGNOSIS — J0100 Acute maxillary sinusitis, unspecified: Secondary | ICD-10-CM | POA: Diagnosis not present

## 2019-07-29 MED ORDER — DOXYCYCLINE HYCLATE 100 MG PO CAPS: 100.0000 mg | ORAL_CAPSULE | Freq: Two times a day (BID) | ORAL | 0 refills | Status: DC

## 2019-07-29 NOTE — ED Triage Notes (Addendum)
Reports left ear pain for last two weeks, green nasal congestion, and began to have dizziness upon standing today.

## 2019-07-29 NOTE — ED Provider Notes (Signed)
[facility]    CSN: [id number] Arrival date & time: 07/29/19  1741  History   Chief Complaint Chief Complaint  Patient presents with  . Otalgia   HPI  58 year old female presents with otalgia.  Patient reports ongoing symptoms for the past 2 weeks.  Reports congestion, dizziness, dental pain, and left ear pain.  Rates her pain as 2/10 in severity.  She is concerned that she has an ear infection.  No documented fever.  No exposures to COVID-19.  No known relieving factors.  No other reported symptoms.  No other complaints.  PMH, Surgical Hx, Family Hx, Social History reviewed and updated as below.  Past Medical History:  Diagnosis Date  . Anxiety   . Arthritis   . Chronic pain   . Hepatitis    Hep B treated w/ Viread   Past Surgical History:  Procedure Laterality Date  . ORIF ORBITAL FRACTURE Right 09/19/2018   Procedure: OPEN REDUCTION INTERNAL FIXATION (ORIF) ORBITAL BLOW OUT FRACTURE (TRANSCONJUNCTIVAL APPROACH);  Surgeon: Geanie Logan, MD;  Location: [facility] ORS;  Service: ENT;  Laterality: Right;   OB History   No obstetric history on file.    Home Medications    Prior to Admission medications   Medication Sig Start Date End Date Taking? Authorizing Provider  escitalopram (LEXAPRO) 20 MG tablet Take 20 mg by mouth daily.  01/07/18  Yes [provider]  gabapentin (NEURONTIN) 100 MG capsule Take 2 capsules (200 mg total) by mouth 3 (three) times daily. 09/19/18  Yes Geanie Logan, MD  omeprazole (PRILOSEC) 40 MG capsule Take 40 mg by mouth daily.   Yes [provider]  OXYCONTIN 15 MG 12 hr tablet Take 15 mg by mouth 2 (two) times daily.  12/16/17  Yes [provider]  tenofovir (VIREAD) 300 MG tablet Take 300 mg by mouth daily.  01/08/18  Yes [provider]  doxycycline (VIBRAMYCIN) 100 MG capsule Take 1 capsule (100 mg total) by mouth 2 (two) times daily. 07/29/19   Tommie Sams, DO  famotidine (PEPCID) 20 MG tablet Take  20 mg by mouth 2 (two) times daily. 07/31/18 07/29/19  [provider]   Family History Family History  Problem Relation Age of Onset  . Thyroid disease Mother   . Other Father        unknown medical history  . Breast cancer Neg Hx    Social History Social History   Tobacco Use  . Smoking status: Current Every Day Smoker    Packs/day: 0.50    Types: Cigarettes  . Smokeless tobacco: Never Used  Substance Use Topics  . Alcohol use: Not Currently  . Drug use: Never   Allergies   Bupropion, Demerol [meperidine hcl], Fentanyl, Morphine, Penicillins, and Trazodone   Review of Systems Review of Systems  Constitutional: Negative for fever.  HENT: Positive for congestion and ear pain.        Dental, pain.   Physical Exam Triage Vital Signs ED Triage Vitals  Enc Vitals Group     BP 07/29/19 1753 (!) 147/73     Pulse Rate 07/29/19 1753 93     Resp 07/29/19 1753 16     Temp 07/29/19 1753 98.5 F (36.9 C)     Temp Source 07/29/19 1753 Oral     SpO2 07/29/19 1753 100 %     Weight 07/29/19 1754 167 lb (75.8 kg)     Height 07/29/19 1754 5\' 2"  (1.575 m)  Head Circumference --      Peak Flow --      Pain Score 07/29/19 1754 2     Pain Loc --      Pain Edu? --      Excl. in [place]? --    Updated Vital Signs BP (!) 147/73 (BP Location: Left Arm)   Pulse 93   Temp 98.5 F (36.9 C) (Oral)   Resp 16   Ht 5\' 2"  (1.575 m)   Wt 75.8 kg   SpO2 100%   BMI 30.54 kg/m   Visual Acuity Right Eye Distance:   Left Eye Distance:   Bilateral Distance:    Right Eye Near:   Left Eye Near:    Bilateral Near:     Physical Exam Vitals signs and nursing note reviewed.  Constitutional:      General: She is not in acute distress.    Appearance: Normal appearance. She is not ill-appearing.  HENT:     Head: Normocephalic and atraumatic.     Right Ear: Tympanic membrane normal.     Left Ear: Tympanic membrane normal.     Nose:     Comments: Mild left maxillary sinus  tenderness to palpation.    Mouth/Throat:     Pharynx: Oropharynx is clear. No oropharyngeal exudate.  Eyes:     General:        Right eye: No discharge.        Left eye: No discharge.     Conjunctiva/sclera: Conjunctivae normal.  Cardiovascular:     Rate and Rhythm: Normal rate and regular rhythm.  Pulmonary:     Effort: Pulmonary effort is normal.     Breath sounds: Normal breath sounds. No wheezing, rhonchi or rales.  Neurological:     Mental Status: She is alert.  Psychiatric:        Mood and Affect: Mood normal.        Behavior: Behavior normal.    UC Treatments / Results  Labs (all labs ordered are listed, but only abnormal results are displayed) Labs Reviewed - No data to display  EKG   Radiology No results found.  Procedures Procedures (including critical care time)  Medications Ordered in UC Medications - No data to display  Initial Impression / Assessment and Plan / UC Course  I have reviewed the triage vital signs and the nursing notes.  Pertinent labs & imaging results that were available during my care of the patient were reviewed by me and considered in my medical decision making (see chart for details).    58 year old female presents with sinusitis.  Treating with doxycycline given penicillin allergy.  Final Clinical Impressions(s) / UC Diagnoses   Final diagnoses:  Acute non-recurrent maxillary sinusitis   Discharge Instructions   None    ED Prescriptions    Medication Sig Dispense Auth. Provider   doxycycline (VIBRAMYCIN) 100 MG capsule Take 1 capsule (100 mg total) by mouth 2 (two) times daily. 14 capsule Thersa Salt G, DO     PDMP not reviewed this encounter.   Coral Spikes, [place] 07/29/19 2008

## 2019-07-31 ENCOUNTER — Other Ambulatory Visit: Payer: Self-pay | Admitting: Unknown Physician Specialty

## 2019-07-31 DIAGNOSIS — Z1231 Encounter for screening mammogram for malignant neoplasm of breast: Secondary | ICD-10-CM

## 2019-08-07 ENCOUNTER — Inpatient Hospital Stay: Admission: RE | Admit: 2019-08-07 | Payer: BLUE CROSS/BLUE SHIELD | Source: Ambulatory Visit

## 2019-08-07 ENCOUNTER — Ambulatory Visit
Admission: RE | Admit: 2019-08-07 | Discharge: 2019-08-07 | Disposition: A | Discharge status: Home / Self Care | Payer: BLUE CROSS/BLUE SHIELD | Source: Ambulatory Visit | Attending: Unknown Physician Specialty | Admitting: Unknown Physician Specialty

## 2019-08-07 ENCOUNTER — Other Ambulatory Visit: Payer: Self-pay

## 2019-08-07 DIAGNOSIS — Z1231 Encounter for screening mammogram for malignant neoplasm of breast: Secondary | ICD-10-CM | POA: Diagnosis not present

## 2020-02-18 ENCOUNTER — Other Ambulatory Visit: Payer: Self-pay

## 2020-02-18 ENCOUNTER — Ambulatory Visit
Admission: EM | Admit: 2020-02-18 | Discharge: 2020-02-18 | Disposition: A | Discharge status: Home / Self Care | Payer: 59 | Attending: Family Medicine | Admitting: Family Medicine

## 2020-02-18 ENCOUNTER — Encounter: Payer: Self-pay | Admitting: Emergency Medicine

## 2020-02-18 DIAGNOSIS — N12 Tubulo-interstitial nephritis, not specified as acute or chronic: Secondary | ICD-10-CM | POA: Diagnosis not present

## 2020-02-18 LAB — URINALYSIS, COMPLETE (UACMP) WITH MICROSCOPIC
Bilirubin Urine: NEGATIVE
Glucose, UA: NEGATIVE mg/dL
Ketones, ur: NEGATIVE mg/dL
Nitrite: POSITIVE — AB
Protein, ur: NEGATIVE mg/dL
Specific Gravity, Urine: 1.015 (ref 1.005–1.030)
WBC, UA: >50 WBC/hpf (ref 0–5)
pH: 6 (ref 5.0–8.0)

## 2020-02-18 MED ORDER — CIPROFLOXACIN HCL 500 MG PO TABS: 500.0000 mg | ORAL_TABLET | Freq: Two times a day (BID) | ORAL | 0 refills | Status: DC

## 2020-02-18 MED ORDER — FLUCONAZOLE 150 MG PO TABS: 150.0000 mg | ORAL_TABLET | Freq: Once | ORAL | 0 refills | Status: AC

## 2020-02-18 NOTE — ED Triage Notes (Signed)
Pt c/o lower back pain, that radiates up and around her left side. She states it is also making both her legs hurt and her upper arms. Started about 3 days ago. Denies dysuria but states she has a decreased urine stream. She states when she take a deep breath the pain is worse. Pt states she took her oxycodone and ibuprofen.

## 2020-02-18 NOTE — Discharge Instructions (Signed)
If you worsen, go to the ER. ° °Take care ° °Dr. Rolonda Pontarelli  °

## 2020-02-18 NOTE — ED Provider Notes (Signed)
[facility]    CSN: [id number] Arrival date & time: 02/18/20  1925  History   Chief Complaint Chief Complaint  Patient presents with  . Back Pain   HPI  59 year old female presents with diffuse pain/body aches and urinary symptoms.   Pain has known chronic pain and is on chronic pain medication. Reports that her symptoms started on Sunday.  She reports a decrease in her urinary stream and also reports urinary urgency and frequency. Having diffuse pain - left back/flank, LLQ pain, leg pain, arm pain. Pain 8/10 in severity. No reported fever at home but currently febrile @ 100.6 She has taken Ibuprofen and her home pain medication without relief. Also reports chest pain with deep breathing. No other associated symptoms. No other complaints.  Past Medical History:  Diagnosis Date  . Anxiety   . Arthritis   . Chronic pain   . Hepatitis    Hep B treated w/ Viread   Past Surgical History:  Procedure Laterality Date  . ORIF ORBITAL FRACTURE Right 09/19/2018   Procedure: OPEN REDUCTION INTERNAL FIXATION (ORIF) ORBITAL BLOW OUT FRACTURE (TRANSCONJUNCTIVAL APPROACH);  Surgeon: Geanie Logan, MD;  Location: [facility] OR[facility];  Service: ENT;  Laterality: Right;   OB History   No obstetric history on file.    Home Medications    Prior to Admission medications   Medication Sig Start Date End Date Taking? Authorizing Provider  escitalopram (LEXAPRO) 20 MG tablet Take 20 mg by mouth daily.  01/07/18  Yes [provider]  gabapentin (NEURONTIN) 100 MG capsule Take 2 capsules (200 mg total) by mouth 3 (three) times daily. 09/19/18  Yes Geanie Logan, MD  omeprazole (PRILOSEC) 40 MG capsule Take 40 mg by mouth daily.   Yes [provider]  OXYCONTIN 15 MG 12 hr tablet Take 15 mg by mouth 2 (two) times daily.  12/16/17  Yes [provider]  tenofovir (VIREAD) 300 MG tablet Take 300 mg by mouth daily.  01/08/18  Yes [provider]  ciprofloxacin (CIPRO) 500  MG tablet Take 1 tablet (500 mg total) by mouth 2 (two) times daily. 02/18/20   Tommie Sams, DO  doxycycline (VIBRAMYCIN) 100 MG capsule Take 1 capsule (100 mg total) by mouth 2 (two) times daily. 07/29/19   Tommie Sams, DO  fluconazole (DIFLUCAN) 150 MG tablet Take 1 tablet (150 mg total) by mouth once for 1 dose. Repeat dose in 72 hours. 02/18/20 02/18/20  Tommie Sams, DO  famotidine (PEPCID) 20 MG tablet Take 20 mg by mouth 2 (two) times daily. 07/31/18 07/29/19  [provider]    Family History Family History  Problem Relation Age of Onset  . Thyroid disease Mother   . Other Father        unknown medical history  . Breast cancer Neg Hx     Social History Social History   Tobacco Use  . Smoking status: Current Every Day Smoker    Packs/day: 0.50    Types: Cigarettes  . Smokeless tobacco: Never Used  Vaping Use  . Vaping Use: Never used  Substance Use Topics  . Alcohol use: Not Currently  . Drug use: Never     Allergies   Bupropion, Demerol [meperidine hcl], Fentanyl, Morphine, Penicillins, and Trazodone   Review of Systems Review of Systems Per HPI  Physical Exam Triage Vital Signs ED Triage Vitals  Enc Vitals Group     BP 02/18/20 1944 119/64     Pulse  Rate 02/18/20 1944 99     Resp 02/18/20 1944 18     Temp 02/18/20 1944 (!) 100.6 F (38.1 C)     Temp Source 02/18/20 1944 Oral     SpO2 02/18/20 1944 97 %     Weight 02/18/20 1940 167 lb (75.8 kg)     Height 02/18/20 1940 5\' 2"  (1.575 m)     Head Circumference --      Peak Flow --      Pain Score 02/18/20 1940 8     Pain Loc --      Pain Edu? --      Excl. in [place]? --    Updated Vital Signs BP 119/64 (BP Location: Left Arm)   Pulse 99   Temp (!) 100.6 F (38.1 C) (Oral)   Resp 18   Ht 5\' 2"  (1.575 m)   Wt 75.8 kg   SpO2 97%   BMI 30.54 kg/m   Visual Acuity Right Eye Distance:   Left Eye Distance:   Bilateral Distance:    Right Eye Near:   Left Eye Near:    Bilateral Near:      Physical Exam Constitutional:      Comments: No acute distress but appears mildly ill.   HENT:     Head: Normocephalic and atraumatic.  Cardiovascular:     Rate and Rhythm: Normal rate and regular rhythm.  Pulmonary:     Effort: Pulmonary effort is normal.     Breath sounds: Normal breath sounds. No wheezing or rales.  Abdominal:     General: There is no distension.     Palpations: Abdomen is soft.     Tenderness: There is left CVA tenderness.     Comments: Left flank and LLQ tenderness.   Neurological:     Mental Status: She is alert.  Psychiatric:        Mood and Affect: Mood normal.        Behavior: Behavior normal.    UC Treatments / Results  Labs (all labs ordered are listed, but only abnormal results are displayed) Labs Reviewed  URINALYSIS, COMPLETE (UACMP) WITH MICROSCOPIC - Abnormal; Notable for the following components:      Result Value   APPearance HAZY (*)    Hgb urine dipstick MODERATE (*)    Nitrite POSITIVE (*)    Leukocytes,Ua MODERATE (*)    Bacteria, UA MANY (*)    All other components within normal limits  URINE CULTURE    EKG   Radiology No results found.  Procedures Procedures (including critical care time)  Medications Ordered in UC Medications - No data to display  Initial Impression / Assessment and Plan / UC Course  I have reviewed the triage vital signs and the nursing notes.  Pertinent labs & imaging results that were available during my care of the patient were reviewed by me and considered in my medical decision making (see chart for details).    59 year old female presents with fever, CVA tenderness and urinary symptoms. UA with positive nitrite. Microscopy with many bacteria, >50 WBC's. Consistent with Pyelonephritis. Treating with Cipro.  Final Clinical Impressions(s) / UC Diagnoses   Final diagnoses:  Pyelonephritis     Discharge Instructions     If you worsen, go to the ER.  Take care  Dr. Lacinda Axon    ED  Prescriptions    Medication Sig Dispense Auth. Provider   ciprofloxacin (CIPRO) 500 MG tablet Take 1 tablet (500 mg total)  by mouth 2 (two) times daily. 20 tablet Oneita Allmon G, DO   fluconazole (DIFLUCAN) 150 MG tablet Take 1 tablet (150 mg total) by mouth once for 1 dose. Repeat dose in 72 hours. 2 tablet Tommie Sams, DO     PDMP not reviewed this encounter.   Tommie Sams, [place] 02/18/20 2217

## 2020-02-21 LAB — URINE CULTURE: Culture: >=100000 — AB

## 2020-08-18 ENCOUNTER — Other Ambulatory Visit: Payer: Self-pay

## 2020-08-18 ENCOUNTER — Ambulatory Visit
Admission: EM | Admit: 2020-08-18 | Discharge: 2020-08-18 | Disposition: A | Discharge status: Home / Self Care | Payer: 59 | Attending: Family Medicine | Admitting: Family Medicine

## 2020-08-18 DIAGNOSIS — Z20822 Contact with and (suspected) exposure to covid-19: Secondary | ICD-10-CM | POA: Insufficient documentation

## 2020-08-18 DIAGNOSIS — F419 Anxiety disorder, unspecified: Secondary | ICD-10-CM | POA: Insufficient documentation

## 2020-08-18 MED ORDER — HYDROXYZINE HCL 25 MG PO TABS: 25.0000 mg | ORAL_TABLET | Freq: Three times a day (TID) | ORAL | 0 refills | Status: AC | PRN

## 2020-08-18 NOTE — Discharge Instructions (Addendum)
Results should be back tomorrow.  Check mychart.   Medication as prescribed.  Take care  Dr. Adriana Simas

## 2020-08-18 NOTE — ED Triage Notes (Signed)
Pt presents with increased anxiety x 5 days.  States she needs something to get her through the next couple weeks, prefers immediate release.  Feels like she goes from 0 to 100 in a split second.  Reports shakiness and breathing hard.  Happens at least once per day.  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   Pt was in car 5 days ago with someone who tested positive for COVID that same day.  Pt herself is asymptomatic.  Requesting testing.

## 2020-08-18 NOTE — ED Provider Notes (Signed)
[facility]    CSN: [id number] Arrival date & time: 08/18/20  1537      History   Chief Complaint Chief Complaint  Patient presents with  . Anxiety   HPI  59 year old female presents with the above complaint.  Patient is currently caring for a young child due to ongoing familial illness with COVID-19.  She reports a great deal of stress secondary to this.  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  Patient reports that she has periods of time where she feels very overwhelmed and is shaky and seems to have difficulty breathing.  She believes that she is experiencing significant anxiety.  Patient also reports recent Covid exposure.  Desires testing today.  Past Medical History:  Diagnosis Date  . Anxiety   . Arthritis   . Chronic pain   . Hepatitis    Hep B treated w/ Viread   Past Surgical History:  Procedure Laterality Date  . ORIF ORBITAL FRACTURE Right 09/19/2018   Procedure: OPEN REDUCTION INTERNAL FIXATION (ORIF) ORBITAL BLOW OUT FRACTURE (TRANSCONJUNCTIVAL APPROACH);  Surgeon: Geanie Logan, MD;  Location: [facility] ORS;  Service: ENT;  Laterality: Right;    OB History   No obstetric history on file.      Home Medications    Prior to Admission medications   Medication Sig Start Date End Date Taking? Authorizing Provider  escitalopram (LEXAPRO) 20 MG tablet Take 20 mg by mouth daily.  01/07/18  Yes [provider]  gabapentin (NEURONTIN) 100 MG capsule Take 2 capsules (200 mg total) by mouth 3 (three) times daily. 09/19/18  Yes Geanie Logan, MD  omeprazole (PRILOSEC) 40 MG capsule Take 40 mg by mouth daily.   Yes [provider]  OXYCONTIN 15 MG 12 hr tablet Take 15 mg by mouth 2 (two) times daily.  12/16/17  Yes [provider]  tenofovir (VIREAD) 300 MG tablet Take 300 mg by mouth daily.  01/08/18  Yes [provider]  buPROPion HCl (WELLBUTRIN PO) Take by mouth.     [provider]  hydrOXYzine (ATARAX/VISTARIL) 25 MG tablet Take 1 tablet (25 mg total) by mouth every 8 (eight) hours as needed for anxiety. 08/18/20   Tommie Sams, DO  famotidine (PEPCID) 20 MG tablet Take 20 mg by mouth 2 (two) times daily. 07/31/18 07/29/19  [provider]    Family History Family History  Problem Relation Age of Onset  . Thyroid disease Mother   . Other Father        unknown medical history  . Breast cancer Neg Hx     Social History Social History   Tobacco Use  . Smoking status: Current Every Day Smoker    Packs/day: 0.50    Types: Cigarettes  . Smokeless tobacco: Never Used  Vaping Use  . Vaping Use: Never used  Substance Use Topics  . Alcohol use: Not Currently  . Drug use: Never     Allergies   Bupropion, Demerol [meperidine hcl], Fentanyl, Morphine, Penicillins, and Trazodone   Review of Systems Review of Systems Per HPI  Physical Exam Triage Vital Signs ED Triage Vitals  Enc Vitals Group     BP 08/18/20 1602 (!) 163/96     Pulse Rate 08/18/20 1602 95     Resp 08/18/20 1602 20     Temp 08/18/20 1602 98.8 F (37.1 C)     Temp Source 08/18/20 1602  Oral     SpO2 08/18/20 1602 100 %     Weight --      Height --      Head Circumference --      Peak Flow --      Pain Score 08/18/20 1604 0     Pain Loc --      Pain Edu? --      Excl. in GC? --    Updated Vital Signs BP (!) 163/96 (BP Location: Right Arm)   Pulse 95   Temp 98.8 F (37.1 C) (Oral)   Resp 20   SpO2 100%   Visual Acuity Right Eye Distance:   Left Eye Distance:   Bilateral Distance:    Right Eye Near:   Left Eye Near:    Bilateral Near:     Physical Exam Vitals and nursing note reviewed.  Constitutional:      General: She is not in acute distress.    Appearance: Normal appearance. She is not ill-appearing.  HENT:     Head: Normocephalic and atraumatic.  Eyes:     General:        Right eye: No discharge.        Left eye: No  discharge.     Conjunctiva/sclera: Conjunctivae normal.  Cardiovascular:     Rate and Rhythm: Normal rate and regular rhythm.     Heart sounds: No murmur heard.   Pulmonary:     Effort: Pulmonary effort is normal.     Breath sounds: Normal breath sounds. No wheezing, rhonchi or rales.  Neurological:     Mental Status: She is alert.    UC Treatments / Results  Labs (all labs ordered are listed, but only abnormal results are displayed) Labs Reviewed  SARS CORONAVIRUS 2 (TAT 6-24 HRS)    EKG   Radiology No results found.  Procedures Procedures (including critical care time)  Medications Ordered in UC Medications - No data to display  Initial Impression / Assessment and Plan / UC Course  I have reviewed the triage vital signs and the nursing notes.  Pertinent labs & imaging results that were available during my care of the patient were reviewed by me and considered in my medical decision making (see chart for details).    59 year old female presents with anxiety and recent Covid exposure.  Awaiting Covid test results.  Treating anxiety with Atarax.  Supportive care.  Final Clinical Impressions(s) / UC Diagnoses   Final diagnoses:  Anxiety  Exposure to COVID-19 virus     Discharge Instructions     Results should be back tomorrow.  Check mychart.   Medication as prescribed.  Take care  Dr. Adriana Simas    ED Prescriptions    Medication Sig Dispense Auth. Provider   hydrOXYzine (ATARAX/VISTARIL) 25 MG tablet Take 1 tablet (25 mg total) by mouth every 8 (eight) hours as needed for anxiety. 30 tablet Tommie Sams, DO     PDMP not reviewed this encounter.   Tommie Sams, [place] 08/18/20 1630

## 2020-08-19 LAB — SARS CORONAVIRUS 2 (TAT 6-24 HRS): SARS Coronavirus 2: NEGATIVE

## 2021-02-03 ENCOUNTER — Other Ambulatory Visit: Payer: Self-pay | Admitting: Unknown Physician Specialty

## 2021-02-03 DIAGNOSIS — Z1231 Encounter for screening mammogram for malignant neoplasm of breast: Secondary | ICD-10-CM

## 2021-02-09 ENCOUNTER — Ambulatory Visit
Admission: EM | Admit: 2021-02-09 | Discharge: 2021-02-09 | Disposition: A | Discharge status: Home / Self Care | Payer: 59 | Attending: Sports Medicine | Admitting: Sports Medicine

## 2021-02-09 ENCOUNTER — Other Ambulatory Visit: Payer: Self-pay

## 2021-02-09 DIAGNOSIS — R590 Localized enlarged lymph nodes: Secondary | ICD-10-CM

## 2021-02-09 DIAGNOSIS — R519 Headache, unspecified: Secondary | ICD-10-CM

## 2021-02-09 DIAGNOSIS — H9202 Otalgia, left ear: Secondary | ICD-10-CM | POA: Diagnosis not present

## 2021-02-09 DIAGNOSIS — J320 Chronic maxillary sinusitis: Secondary | ICD-10-CM | POA: Diagnosis not present

## 2021-02-09 MED ORDER — DOXYCYCLINE HYCLATE 100 MG PO CAPS: 100.0000 mg | ORAL_CAPSULE | Freq: Two times a day (BID) | ORAL | 0 refills | Status: AC

## 2021-02-09 MED ORDER — FLUTICASONE PROPIONATE 50 MCG/ACT NA SUSP: 2.0000 | Freq: Every day | NASAL | 0 refills | Status: AC

## 2021-02-09 NOTE — ED Notes (Signed)
Patient also has complaints of pain in right lower leg/medial.  Feels like it has been hit, but no known injury and pain continues

## 2021-02-09 NOTE — ED Triage Notes (Signed)
Neck soreness to left side of neck and painful to swallow, left facial pain.  Symptoms for a week.  Patient has flown during this time frame.

## 2021-02-09 NOTE — Discharge Instructions (Addendum)
As we discussed, it appears as though you have a left-sided sinus infection.  This is causing you to have the left-sided facial pain and ear pressure.  It is also causing her to have a tenderness in the left side of your neck as your lymph nodes are draining. I sent in a prescription for an antibiotic as well as a nasal steroid spray. If symptoms persist please see your primary care provider. If they worsen please go to the ER. Use Tylenol or Motrin for any fever or discomfort.

## 2021-02-09 NOTE — ED Provider Notes (Signed)
[facility]    CSN: [id number] Arrival date & time: 02/09/21  1717      History   Chief Complaint Chief Complaint  Patient presents with   URI    HPI Christina Roy is a 60 y.o. female.   60 year old female who presents for evaluation of the above issue.  She says she has had symptoms for about 7 to 10 days.  She is having left-sided facial pressure, sinus pressure, left-sided neck pain and points over the anterior aspect of her neck with some pain with swallowing.  She was down to [place] taking care of a family issue and has recently flown home.  Normally sees [facility] in [place].  She has not sought out any medical attention.  She owns her own business.  She denies any chest pain or shortness of breath.  No fever shakes chills.  No abdominal or urinary symptoms.  No red flag signs or symptoms elicited on history.   Past Medical History:  Diagnosis Date   Anxiety    Arthritis    Chronic pain    Hepatitis    Hep B treated w/ Viread    There are no problems to display for this patient.   Past Surgical History:  Procedure Laterality Date   ORIF ORBITAL FRACTURE Right 09/19/2018   Procedure: OPEN REDUCTION INTERNAL FIXATION (ORIF) ORBITAL BLOW OUT FRACTURE (TRANSCONJUNCTIVAL APPROACH);  Surgeon: Geanie Logan, MD;  Location: [facility];  Service: ENT;  Laterality: Right;    OB History   No obstetric history on file.      Home Medications    Prior to Admission medications   Medication Sig Start Date End Date Taking? Authorizing Provider  doxycycline (VIBRAMYCIN) 100 MG capsule Take 1 capsule (100 mg total) by mouth 2 (two) times daily. 02/09/21  Yes Delton See, MD  fluticasone [facility]) 50 MCG/ACT nasal spray Place 2 sprays into both nostrils daily. 02/09/21  Yes Delton See, MD  buPROPion HCl (WELLBUTRIN PO) Take by mouth.    [provider]  escitalopram (LEXAPRO) 20 MG tablet Take 20 mg by mouth daily.  01/07/18    [provider]  gabapentin (NEURONTIN) 100 MG capsule Take 2 capsules (200 mg total) by mouth 3 (three) times daily. 09/19/18   Geanie Logan, MD  hydrOXYzine (ATARAX/VISTARIL) 25 MG tablet Take 1 tablet (25 mg total) by mouth every 8 (eight) hours as needed for anxiety. 08/18/20   Tommie Sams, DO  omeprazole (PRILOSEC) 40 MG capsule Take 40 mg by mouth daily.    [provider]  OXYCONTIN 15 MG 12 hr tablet Take 15 mg by mouth 2 (two) times daily.  12/16/17   [provider]  tenofovir (VIREAD) 300 MG tablet Take 300 mg by mouth daily.  01/08/18   [provider]  famotidine (PEPCID) 20 MG tablet Take 20 mg by mouth 2 (two) times daily. 07/31/18 07/29/19  [provider]    Family History Family History  Problem Relation Age of Onset   Thyroid disease Mother    Other Father        unknown medical history   Breast cancer Neg Hx     Social History Social History   Tobacco Use   Smoking status: Every Day    Packs/day: 0.50    Pack years: 0.00    Types: Cigarettes   Smokeless tobacco: Never  Vaping Use   Vaping Use: Never used  Substance Use Topics   Alcohol  use: Not Currently   Drug use: Never     Allergies   Bupropion, Demerol [meperidine hcl], Fentanyl, Morphine, Penicillins, and Trazodone   Review of Systems Review of Systems  Constitutional:  Negative for activity change, appetite change, chills, diaphoresis, fatigue and fever.  HENT:  Positive for ear pain, sinus pressure, sinus pain and sore throat. Negative for congestion, ear discharge, postnasal drip, rhinorrhea and sneezing.   Eyes:  Negative for pain.  Respiratory:  Negative for cough, chest tightness, shortness of breath and wheezing.   Cardiovascular:  Negative for chest pain and palpitations.  Gastrointestinal:  Negative for abdominal pain, diarrhea, nausea and vomiting.  Genitourinary:  Negative for dysuria.  Musculoskeletal:  Positive for neck pain. Negative for  back pain, myalgias and neck stiffness.  Skin:  Negative for color change, pallor, rash and wound.  Neurological:  Negative for dizziness, light-headedness and headaches.  Hematological:  Positive for adenopathy.  All other systems reviewed and are negative.   Physical Exam Triage Vital Signs ED Triage Vitals  Enc Vitals Group     BP 02/09/21 1806 (!) 164/76     Pulse Rate 02/09/21 1806 72     Resp 02/09/21 1806 (!) 22     Temp 02/09/21 1806 98.2 F (36.8 C)     Temp Source 02/09/21 1806 Oral     SpO2 02/09/21 1806 100 %     Weight --      Height --      Head Circumference --      Peak Flow --      Pain Score 02/09/21 1803 6     Pain Loc --      Pain Edu? --      Excl. in GC? --    No data found.  Updated Vital Signs BP (!) 164/76 (BP Location: Left Arm)   Pulse 72   Temp 98.2 F (36.8 C) (Oral)   Resp (!) 22   SpO2 100%   Visual Acuity Right Eye Distance:   Left Eye Distance:   Bilateral Distance:    Right Eye Near:   Left Eye Near:    Bilateral Near:     Physical Exam Vitals and nursing note reviewed.  Constitutional:      General: She is not in acute distress.    Appearance: Normal appearance. She is not ill-appearing, toxic-appearing or diaphoretic.     Comments: Uncomfortable appearing at times throughout examination.  HENT:     Head: Normocephalic and atraumatic.     Ears:     Comments: Patient is fullness in the left TM from the sinus pressure.  No active otitis media.    Nose: No congestion.     Left Sinus: Maxillary sinus tenderness and frontal sinus tenderness present.     Mouth/Throat:     Mouth: Mucous membranes are moist.  Eyes:     General: No scleral icterus.    Conjunctiva/sclera: Conjunctivae normal.     Pupils: Pupils are equal, round, and reactive to light.  Cardiovascular:     Rate and Rhythm: Normal rate and regular rhythm.     Pulses: Normal pulses.     Heart sounds: Normal heart sounds. No murmur heard.   No friction rub. No  gallop.  Pulmonary:     Effort: Pulmonary effort is normal.     Breath sounds: Normal breath sounds. No stridor. No wheezing, rhonchi or rales.  Musculoskeletal:     Cervical back: Normal range of motion and neck  supple. Tenderness present. No rigidity.  Lymphadenopathy:     Cervical: Cervical adenopathy present.  Skin:    General: Skin is warm and dry.     Capillary Refill: Capillary refill takes less than 2 seconds.     Coloration: Skin is not jaundiced.     Findings: No bruising, erythema, lesion or rash.  Neurological:     General: No focal deficit present.     Mental Status: She is alert and oriented to person, place, and time.    UC Treatments / Results  Labs (all labs ordered are listed, but only abnormal results are displayed) Labs Reviewed - No data to display  EKG   Radiology No results found.  Procedures Procedures (including critical care time)  Medications Ordered in UC Medications - No data to display  Initial Impression / Assessment and Plan / UC Course  I have reviewed the triage vital signs and the nursing notes.  Pertinent labs & imaging results that were available during my care of the patient were reviewed by me and considered in my medical decision making (see chart for details).  Clinical impression: Left-sided facial pain, sinus pressure, ear pressure, left sided anterior neck pain with lymphadenopathy.  All of her symptoms are consistent with an acute sinusitis.  Treatment plan: 1.  The findings and treatment were discussed in detail with the patient.  Patient was in agreement. 2.  Recommended treating her with an antibiotic.  She has a penicillin allergy.  Gave her doxycycline.  Sent to her pharmacy. 3.  To help with some of the congestion in her sinuses prescribed Flonase as well. 4.  Educational handouts provided. 5.  If symptoms persist she should see her primary care physician. 6.  If they worsen in any way she needs to go to the ER. 7.   Plenty of rest, plenty of fluids, Tylenol or Motrin for any fever or discomfort. 8.  She was discharged in stable condition and she will follow-up here as needed.    Final Clinical Impressions(s) / UC Diagnoses   Final diagnoses:  Left maxillary sinusitis  Left facial pressure and pain  Left ear pain  Left cervical lymphadenopathy     Discharge Instructions      As we discussed, it appears as though you have a left-sided sinus infection.  This is causing you to have the left-sided facial pain and ear pressure.  It is also causing her to have a tenderness in the left side of your neck as your lymph nodes are draining. I sent in a prescription for an antibiotic as well as a nasal steroid spray. If symptoms persist please see your primary care provider. If they worsen please go to the ER. Use Tylenol or Motrin for any fever or discomfort.     ED Prescriptions     Medication Sig Dispense Auth. Provider   fluticasone (FLONASE) 50 MCG/ACT nasal spray Place 2 sprays into both nostrils daily. 15.8 mL Delton See, MD   doxycycline (VIBRAMYCIN) 100 MG capsule Take 1 capsule (100 mg total) by mouth 2 (two) times daily. 20 capsule Delton See, MD      PDMP not reviewed this encounter.   Delton See, MD 02/09/21 [phone]

## 2021-02-11 ENCOUNTER — Ambulatory Visit
Admission: RE | Admit: 2021-02-11 | Discharge: 2021-02-11 | Disposition: A | Discharge status: Home / Self Care | Payer: 59 | Source: Ambulatory Visit | Attending: Unknown Physician Specialty | Admitting: Unknown Physician Specialty

## 2021-02-11 ENCOUNTER — Other Ambulatory Visit: Payer: Self-pay

## 2021-02-11 DIAGNOSIS — Z1231 Encounter for screening mammogram for malignant neoplasm of breast: Secondary | ICD-10-CM | POA: Diagnosis not present

## 2021-10-29 ENCOUNTER — Other Ambulatory Visit: Payer: Self-pay | Admitting: Unknown Physician Specialty

## 2021-10-29 DIAGNOSIS — Z1231 Encounter for screening mammogram for malignant neoplasm of breast: Secondary | ICD-10-CM

## 2022-02-14 ENCOUNTER — Ambulatory Visit
Admission: RE | Admit: 2022-02-14 | Discharge: 2022-02-14 | Disposition: A | Discharge status: Home / Self Care | Payer: Self-pay | Source: Ambulatory Visit | Attending: Unknown Physician Specialty | Admitting: Unknown Physician Specialty

## 2022-02-14 DIAGNOSIS — Z1231 Encounter for screening mammogram for malignant neoplasm of breast: Secondary | ICD-10-CM | POA: Insufficient documentation

## 2022-05-03 ENCOUNTER — Encounter: Payer: Self-pay | Admitting: Emergency Medicine

## 2022-05-03 ENCOUNTER — Ambulatory Visit
Admission: EM | Admit: 2022-05-03 | Discharge: 2022-05-03 | Disposition: A | Discharge status: Home / Self Care | Payer: BLUE CROSS/BLUE SHIELD

## 2022-05-03 DIAGNOSIS — M7661 Achilles tendinitis, right leg: Secondary | ICD-10-CM | POA: Diagnosis not present

## 2022-05-03 MED ORDER — METHYLPREDNISOLONE 4 MG PO TBPK: ORAL_TABLET | ORAL | 0 refills | Status: AC

## 2022-05-03 MED ORDER — METHYLPREDNISOLONE 4 MG PO TBPK: ORAL_TABLET | ORAL | 0 refills | Status: DC

## 2022-05-03 NOTE — ED Provider Notes (Signed)
[facility]    CSN: [id number] Arrival date & time: 05/03/22  1529      History   Chief Complaint Chief Complaint  Patient presents with   Foot Pain    HPI Kelaya Craghead is a 61 y.o. female.   HPI  61 year old female here for evaluation of right heel pain.  Patient reports that she has been experiencing pain in her right heel for the last 4 weeks.  This pain is worse with ambulation and is better at rest.  She states that she had this previously about 6 years ago and was diagnosed with Achilles tendinitis.  She states that symptoms are exactly the same.  Past Medical History:  Diagnosis Date   Anxiety    Arthritis    Chronic pain    Hepatitis    Hep B treated w/ Viread    There are no problems to display for this patient.   Past Surgical History:  Procedure Laterality Date   ORIF ORBITAL FRACTURE Right 09/19/2018   Procedure: OPEN REDUCTION INTERNAL FIXATION (ORIF) ORBITAL BLOW OUT FRACTURE (TRANSCONJUNCTIVAL APPROACH);  Surgeon: Clyde Canterbury, MD;  Location: [facility] OR[facility];  Service: ENT;  Laterality: Right;    OB History   No obstetric history on file.      Home Medications    Prior to Admission medications   Medication Sig Start Date End Date Taking? Authorizing Provider  diclofenac Sodium (VOLTAREN) 1 % GEL Apply topically. 02/01/22 07/01/22 Yes [provider]  omeprazole (PRILOSEC) 40 MG capsule Take 1 capsule by mouth daily. 10/15/21  Yes [provider]  buPROPion HCl (WELLBUTRIN PO) Take by mouth.    [provider]  doxycycline (VIBRAMYCIN) 100 MG capsule Take 1 capsule (100 mg total) by mouth 2 (two) times daily. 02/09/21   Verda Cumins, MD  escitalopram (LEXAPRO) 20 MG tablet Take 20 mg by mouth daily.  01/07/18   [provider]  fluticasone (FLONASE) 50 MCG/ACT nasal spray Place 2 sprays into both nostrils daily. 02/09/21   Verda Cumins, MD  gabapentin (NEURONTIN) 100 MG capsule Take 2 capsules (200  mg total) by mouth 3 (three) times daily. 09/19/18   Clyde Canterbury, MD  hydrOXYzine (ATARAX/VISTARIL) 25 MG tablet Take 1 tablet (25 mg total) by mouth every 8 (eight) hours as needed for anxiety. 08/18/20   Coral Spikes, DO  ibuprofen (ADVIL) 800 MG tablet Take 800 mg by mouth 2 (two) times daily as needed. 04/19/22   [provider]  methylPREDNISolone (MEDROL DOSEPAK) 4 MG TBPK tablet Take according to the package insert. 05/03/22   Margarette Canada, NP  omeprazole (PRILOSEC) 40 MG capsule Take 40 mg by mouth daily.    [provider]  oxyCODONE (OXY IR/ROXICODONE) 5 MG immediate release tablet Take by mouth. 04/20/22   [provider]  oxyCODONE (OXY IR/ROXICODONE) 5 MG immediate release tablet Take by mouth. 05/20/22 06/19/22  [provider]  OXYCONTIN 15 MG 12 hr tablet Take 15 mg by mouth 2 (two) times daily.  12/16/17   [provider]  tenofovir (VIREAD) 300 MG tablet Take 300 mg by mouth daily.  01/08/18   [provider]  famotidine (PEPCID) 20 MG tablet Take 20 mg by mouth 2 (two) times daily. 07/31/18 07/29/19  [provider]    Family History Family History  Problem Relation Age of Onset   Thyroid disease Mother    Other Father        unknown medical history  Breast cancer Neg Hx     Social History Social History   Tobacco Use   Smoking status: Every Day    Packs/day: 0.50    Types: Cigarettes   Smokeless tobacco: Never  Vaping Use   Vaping Use: Never used  Substance Use Topics   Alcohol use: Not Currently   Drug use: Never     Allergies   Bupropion, Demerol [meperidine hcl], Fentanyl, Morphine, Penicillins, and Trazodone   Review of Systems Review of Systems  Musculoskeletal:  Positive for arthralgias and myalgias. Negative for joint swelling.  Skin:  Negative for color change.  Neurological:  Negative for weakness and numbness.  Hematological: Negative.   Psychiatric/Behavioral: Negative.        Physical Exam Triage Vital Signs ED Triage Vitals  Enc Vitals Group     BP 05/03/22 1625 (!) 164/92     Pulse Rate 05/03/22 1625 69     Resp 05/03/22 1625 17     Temp 05/03/22 1625 97.8 F (36.6 C)     Temp Source 05/03/22 1625 Oral     SpO2 05/03/22 1625 100 %     Weight 05/03/22 1626 176 lb (79.8 kg)     Height 05/03/22 1626 5\' 2"  (1.575 m)     Head Circumference --      Peak Flow --      Pain Score 05/03/22 1623 7     Pain Loc --      Pain Edu? --      Excl. in GC? --    No data found.  Updated Vital Signs BP (!) 164/92 (BP Location: Right Arm)   Pulse 69   Temp 97.8 F (36.6 C) (Oral)   Resp 17   Ht 5\' 2"  (1.575 m)   Wt 176 lb (79.8 kg)   SpO2 100%   BMI 32.19 kg/m   Visual Acuity Right Eye Distance:   Left Eye Distance:   Bilateral Distance:    Right Eye Near:   Left Eye Near:    Bilateral Near:     Physical Exam Vitals and nursing note reviewed.  Constitutional:      Appearance: Normal appearance. She is not ill-appearing.  HENT:     Head: Normocephalic and atraumatic.  Musculoskeletal:        General: Tenderness present. No swelling, deformity or signs of injury. Normal range of motion.  Skin:    General: Skin is warm and dry.     Capillary Refill: Capillary refill takes less than 2 seconds.     Findings: No bruising or erythema.  Neurological:     General: No focal deficit present.     Mental Status: She is alert and oriented to person, place, and time.  Psychiatric:        Mood and Affect: Mood normal.        Behavior: Behavior normal.        Thought Content: Thought content normal.        Judgment: Judgment normal.      UC Treatments / Results  Labs (all labs ordered are listed, but only abnormal results are displayed) Labs Reviewed - No data to display  EKG   Radiology No results found.  Procedures Procedures (including critical care time)  Medications Ordered in UC Medications - No data to display  Initial  Impression / Assessment and Plan / UC Course  I have reviewed the triage vital signs and the nursing notes.  Pertinent labs & imaging results  that were available during my care of the patient were reviewed by me and considered in my medical decision making (see chart for details).   Patient is a nontoxic-appearing 61 year old female here for evaluation of pain in the posterior aspect of her right ankle has been going on for last 4 weeks.  She rates the pain as being at the insertion site of her Achilles tendon into her calcaneus.  She states that the pain is just isolated there and does not extend up her Achilles or down into her foot.  No numbness or tingling in her foot.  Patient does have tenderness to palpation of the insertion of her Achilles tendon.  There is no tenderness or cording with palpation of the length of the Achilles tendon or with palpation of the calf.  The pain is not increased with dorsiflexion of the foot.  There is no erythema or ecchymosis noted.  I suspect the patient has a recurrence of her Achilles tendinitis.  I will treat her with a Medrol Dosepak and home physical therapy.  I advised her that if her symptoms not improve she should follow-up with orthopedics.   Final Clinical Impressions(s) / UC Diagnoses   Final diagnoses:  Achilles tendinitis, right leg     Discharge Instructions      Take the Medrol dose pack as directed.  Stretch your achilles tendon 10 time every couple fo hours during the day to keep the tendon from becoming stiff and risking rupture.  If your pain continues, or worsens, follow-up with Orthopedics.      ED Prescriptions     Medication Sig Dispense Auth. Provider   methylPREDNISolone (MEDROL DOSEPAK) 4 MG TBPK tablet  (Status: Discontinued) Take according to the package insert. 1 each Becky Augusta, NP   methylPREDNISolone (MEDROL DOSEPAK) 4 MG TBPK tablet Take according to the package insert. 1 each Becky Augusta, NP      PDMP not  reviewed this encounter.   Becky Augusta, NP 05/03/22 1706

## 2022-05-03 NOTE — ED Triage Notes (Signed)
Pt reports right heel pain x 4 weeks.

## 2022-05-03 NOTE — Discharge Instructions (Signed)
Take the Medrol dose pack as directed.  Stretch your achilles tendon 10 time every couple fo hours during the day to keep the tendon from becoming stiff and risking rupture.  If your pain continues, or worsens, follow-up with Orthopedics.

## 2022-12-30 IMAGING — MG MM DIGITAL SCREENING BILAT W/ TOMO AND CAD
8 series · 8 of 24 positions shown · non-contrast
Comparison: Previous exam(s).

CLINICAL DATA: Screening.

EXAM:
DIGITAL SCREENING BILATERAL MAMMOGRAM WITH TOMOSYNTHESIS AND CAD
TECHNIQUE: Bilateral screening digital craniocaudal and mediolateral oblique
mammograms were obtained. Bilateral screening digital breast
tomosynthesis was performed. The images were evaluated with
computer-aided detection.

[L MLO synth-2D]
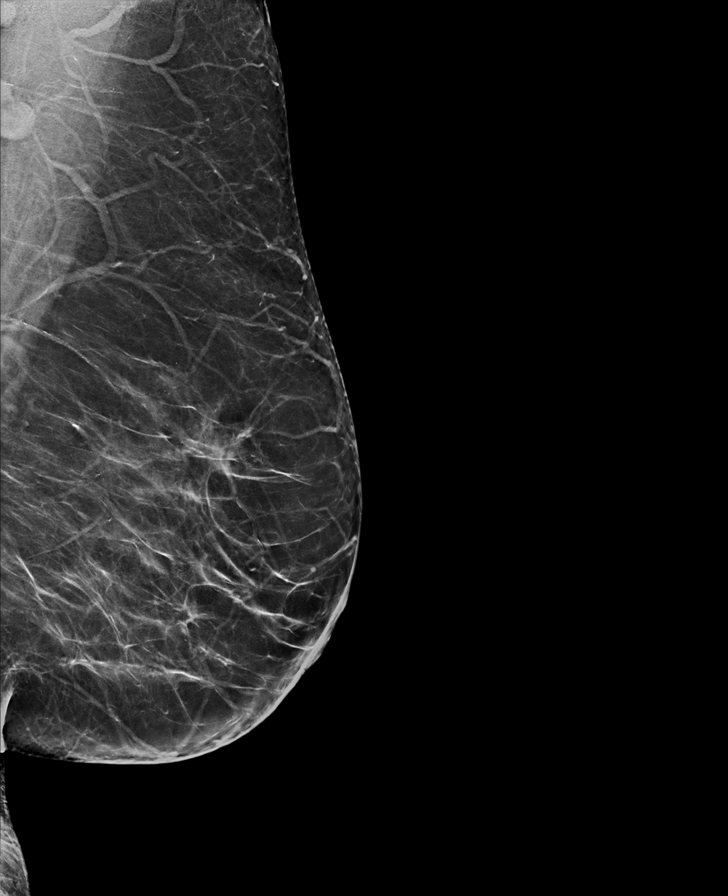

[R CC synth-2D]
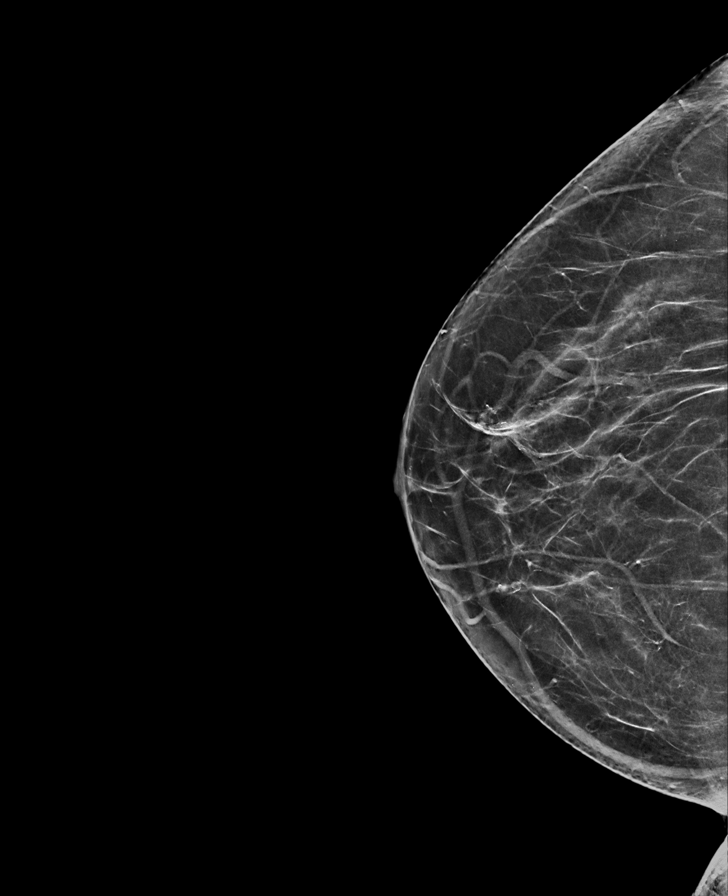

[L CC synth-2D]
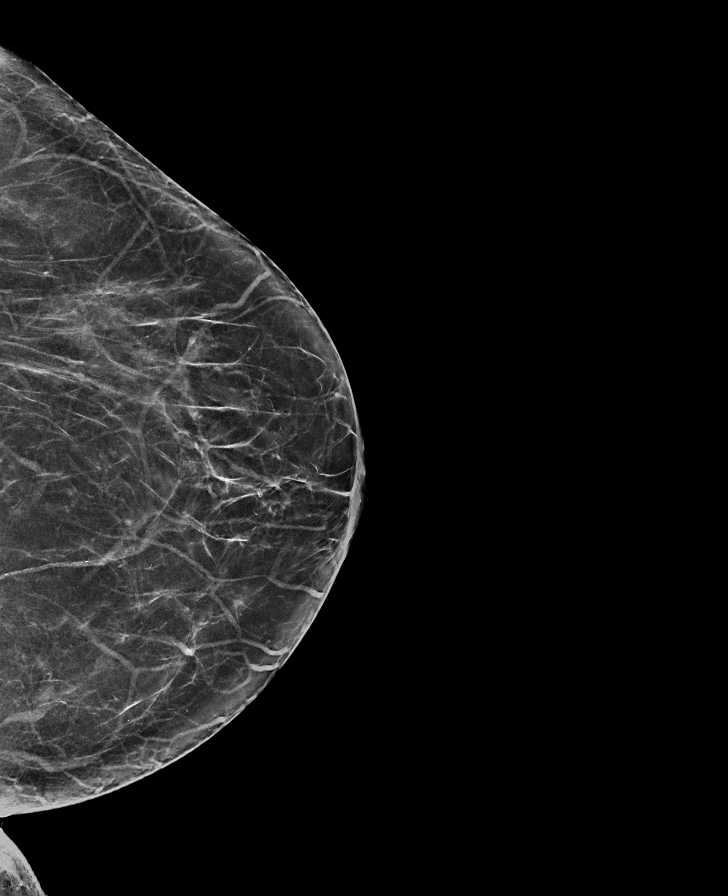

[R MLO synth-2D]
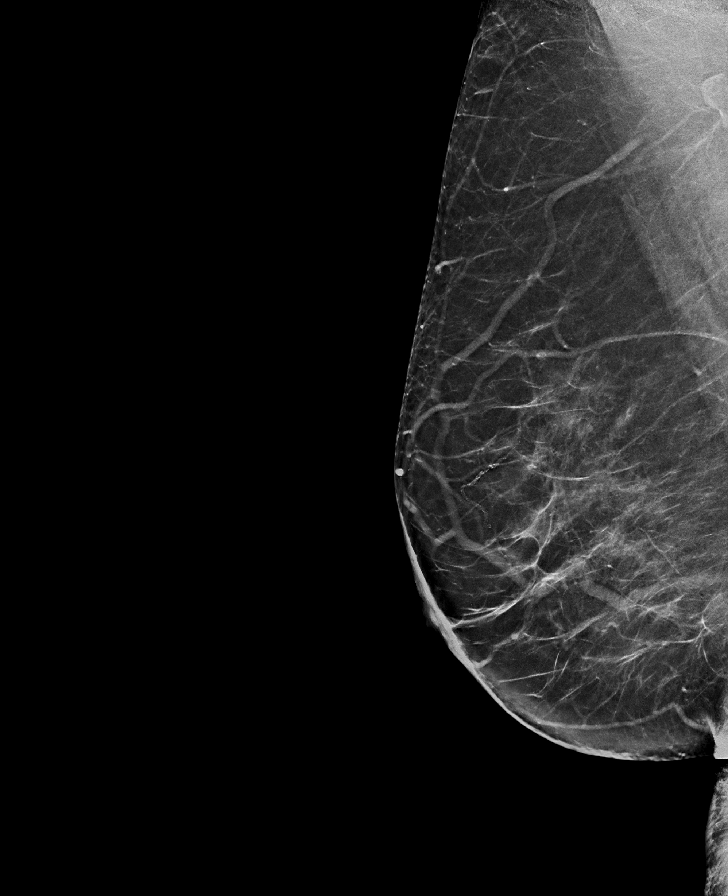

[R MLO tomo · tomo slice 37/73.0]
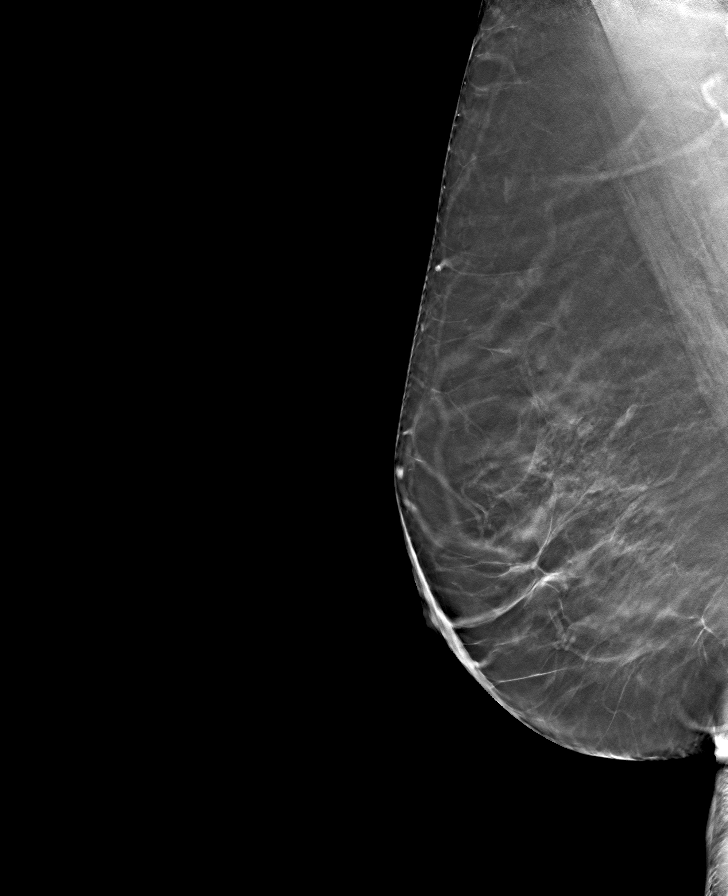

[L CC tomo · tomo slice 35/69.0]
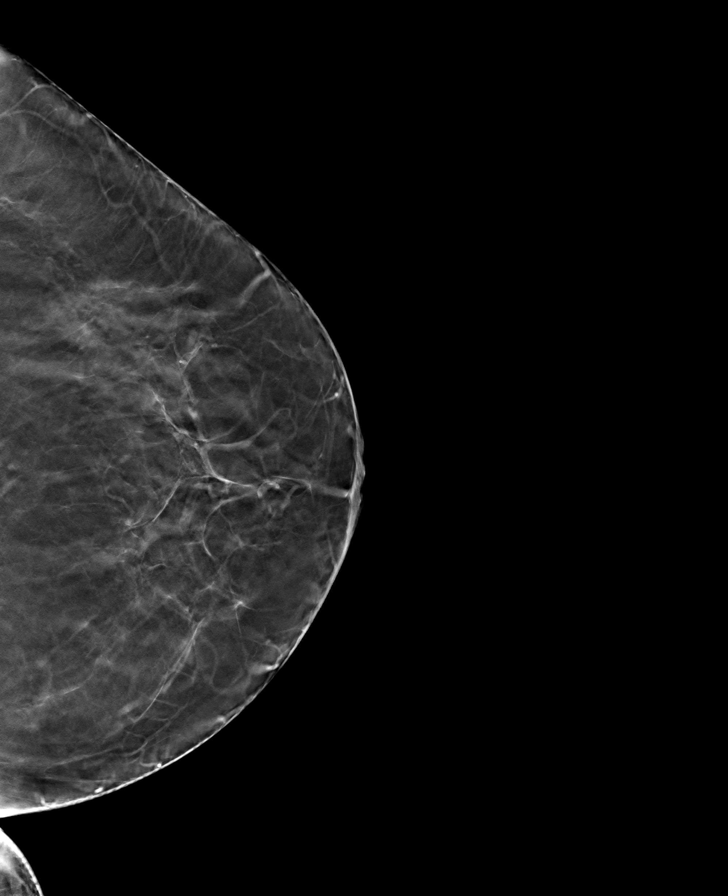

[L MLO tomo · tomo slice 37/74.0]
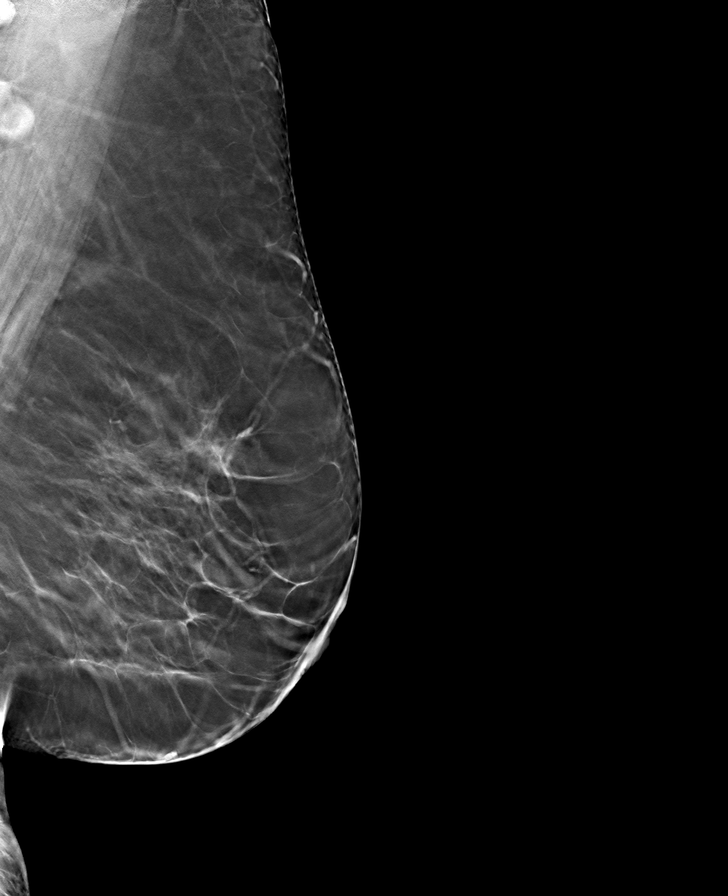

[R CC tomo · tomo slice 34/67.0]
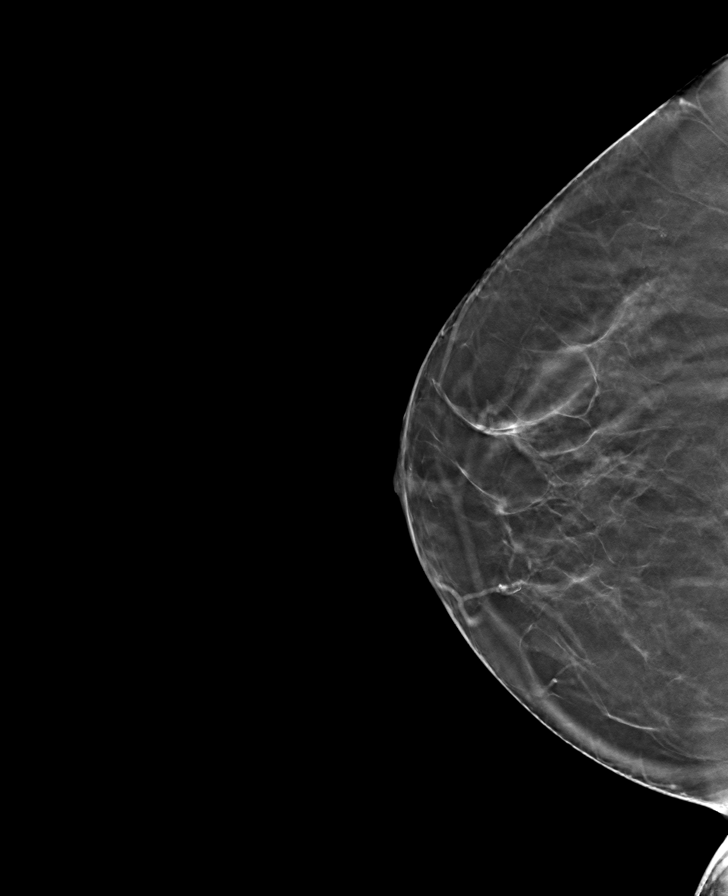

[8 of 24 positions shown; findings below may reference images not displayed]

ACR Breast Density Category b: There are scattered areas of
fibroglandular density.
FINDINGS: There are no findings suspicious for malignancy.
IMPRESSION: No mammographic evidence of malignancy. A result letter of this
screening mammogram will be mailed directly to the patient.

RECOMMENDATION:
Screening mammogram in one year. (Code:51-O-LD2)

BI-RADS CATEGORY  1: Negative.

## 2023-03-23 ENCOUNTER — Other Ambulatory Visit: Payer: Self-pay | Admitting: Unknown Physician Specialty

## 2023-03-23 DIAGNOSIS — Z1231 Encounter for screening mammogram for malignant neoplasm of breast: Secondary | ICD-10-CM

## 2023-04-19 ENCOUNTER — Inpatient Hospital Stay: Admission: RE | Admit: 2023-04-19 | Payer: BLUE CROSS/BLUE SHIELD | Source: Ambulatory Visit

## 2023-05-24 ENCOUNTER — Ambulatory Visit
Admission: RE | Admit: 2023-05-24 | Discharge: 2023-05-24 | Disposition: A | Discharge status: Home / Self Care | Payer: Medicare HMO | Source: Ambulatory Visit | Attending: Unknown Physician Specialty | Admitting: Unknown Physician Specialty

## 2023-05-24 DIAGNOSIS — Z1231 Encounter for screening mammogram for malignant neoplasm of breast: Secondary | ICD-10-CM | POA: Insufficient documentation

## 2023-09-13 ENCOUNTER — Encounter: Payer: Self-pay | Admitting: Emergency Medicine

## 2023-09-13 ENCOUNTER — Ambulatory Visit
Admission: EM | Admit: 2023-09-13 | Discharge: 2023-09-13 | Disposition: A | Discharge status: Home / Self Care | Payer: Medicare Other | Attending: Family Medicine | Admitting: Family Medicine

## 2023-09-13 ENCOUNTER — Ambulatory Visit (INDEPENDENT_AMBULATORY_CARE_PROVIDER_SITE_OTHER): Payer: Medicare Other

## 2023-09-13 DIAGNOSIS — M79631 Pain in right forearm: Secondary | ICD-10-CM | POA: Diagnosis not present

## 2023-09-13 DIAGNOSIS — M25531 Pain in right wrist: Secondary | ICD-10-CM

## 2023-09-13 DIAGNOSIS — S5011XA Contusion of right forearm, initial encounter: Secondary | ICD-10-CM | POA: Diagnosis not present
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
## 2023-09-13 NOTE — Discharge Instructions (Addendum)
 Your x-rays did not show any evidence of broken bones though you do have soft tissue swelling visible on the x-ray indicating that the soft tissues have been bruised.  Continue to apply ice to your forearm for 20 minutes at a time, 2-3 times a day, to help with pain and inflammation.  Keep your right forearm elevated is much as possible to help decrease swelling and aid in pain relief.  Continue to take your OxyContin  as previously prescribed.  You may supplement with over-the-counter Tylenol  and/or ibuprofen as needed for pain and inflammation.  If this medication is not controlling your pain I would contact your pain specialist to discuss other therapeutic regimens.

## 2023-09-28 ENCOUNTER — Encounter: Payer: Self-pay | Admitting: Ophthalmology

## 2023-10-02 NOTE — Anesthesia Preprocedure Evaluation (Addendum)
Anesthesia Evaluation  Patient identified by MRN, date of birth, ID band Patient awake    Reviewed: Allergy & Precautions, H&P , NPO status , Patient's Chart, lab work & pertinent test results  Airway Mallampati: II  TM Distance: >3 FB Neck ROM: Full    Dental no notable dental hx.    Pulmonary Current Smoker   Pulmonary exam normal breath sounds clear to auscultation       Cardiovascular negative cardio ROS Normal cardiovascular exam Rhythm:Regular Rate:Normal     Neuro/Psych   Anxiety     negative neurological ROS  negative psych ROS   GI/Hepatic negative GI ROS, Neg liver ROS,,,(+) Hepatitis -  Endo/Other  negative endocrine ROS    Renal/GU negative Renal ROS  negative genitourinary   Musculoskeletal negative musculoskeletal ROS (+) Arthritis ,    Abdominal   Peds negative pediatric ROS (+)  Hematology negative hematology ROS (+)   Anesthesia Other Findings Hepatitis Arthritis Anxiety Chronic pain  States fentanyl is not an allergy, but a fentanyl patch "makes me feel funny" states she CAN take fentanyl IV  Reproductive/Obstetrics negative OB ROS                             Anesthesia Physical Anesthesia Plan  ASA: 2  Anesthesia Plan: MAC   Post-op Pain Management:    Induction: Intravenous  PONV Risk Score and Plan:   Airway Management Planned: Natural Airway and Nasal Cannula  Additional Equipment:   Intra-op Plan:   Post-operative Plan:   Informed Consent: I have reviewed the patients History and Physical, chart, labs and discussed the procedure including the risks, benefits and alternatives for the proposed anesthesia with the patient or authorized representative who has indicated his/her understanding and acceptance.     Dental Advisory Given  Plan Discussed with: Anesthesiologist, CRNA and Surgeon  Anesthesia Plan Comments: (Patient consented for risks of  anesthesia including but not limited to:  - adverse reactions to medications - damage to eyes, teeth, lips or other oral mucosa - nerve damage due to positioning  - sore throat or hoarseness - Damage to heart, brain, nerves, lungs, other parts of body or loss of life  Patient voiced understanding and assent.)       Anesthesia Quick Evaluation

## 2023-10-13 NOTE — Discharge Instructions (Signed)

## 2023-10-16 ENCOUNTER — Encounter
Admission: RE | Disposition: A | Discharge status: Home / Self Care | Payer: Self-pay | Source: Home / Self Care | Attending: Ophthalmology

## 2023-10-16 ENCOUNTER — Ambulatory Visit: Payer: Medicare Other | Admitting: Anesthesiology

## 2023-10-16 ENCOUNTER — Ambulatory Visit
Admission: RE | Admit: 2023-10-16 | Discharge: 2023-10-16 | Disposition: A | Discharge status: Home / Self Care | Payer: Medicare Other | Attending: Ophthalmology | Admitting: Ophthalmology

## 2023-10-16 ENCOUNTER — Other Ambulatory Visit: Payer: Self-pay

## 2023-10-16 DIAGNOSIS — M1990 Unspecified osteoarthritis, unspecified site: Secondary | ICD-10-CM | POA: Diagnosis not present

## 2023-10-16 DIAGNOSIS — H2511 Age-related nuclear cataract, right eye: Secondary | ICD-10-CM | POA: Diagnosis present

## 2023-10-16 DIAGNOSIS — F17210 Nicotine dependence, cigarettes, uncomplicated: Secondary | ICD-10-CM | POA: Insufficient documentation

## 2023-10-16 DIAGNOSIS — F419 Anxiety disorder, unspecified: Secondary | ICD-10-CM | POA: Diagnosis not present

## 2023-10-16 HISTORY — PX: CATARACT EXTRACTION W/PHACO: SHX586

## 2023-10-16 SURGERY — PHACOEMULSIFICATION, CATARACT, WITH IOL INSERTION
Anesthesia: Monitor Anesthesia Care | Laterality: Right

## 2023-10-16 MED ORDER — TETRACAINE HCL 0.5 % OP SOLN
OPHTHALMIC | Status: AC
  Filled 2023-10-16: qty 4

## 2023-10-16 MED ORDER — SIGHTPATH DOSE#1 BSS IO SOLN
INTRAOCULAR | Status: DC | PRN
  Administered 2023-10-16: 140 mL via OPHTHALMIC

## 2023-10-16 MED ORDER — MIDAZOLAM HCL 2 MG/2ML IJ SOLN
INTRAMUSCULAR | Status: DC | PRN
  Administered 2023-10-16 (×2): 1 mg via INTRAVENOUS

## 2023-10-16 MED ORDER — LIDOCAINE HCL (PF) 2 % IJ SOLN
INTRAOCULAR | Status: DC | PRN
  Administered 2023-10-16: 4 mL via INTRAOCULAR

## 2023-10-16 MED ORDER — ARMC OPHTHALMIC DILATING DROPS
1.0000 | OPHTHALMIC | Status: DC | PRN
  Administered 2023-10-16 (×3): 1 via OPHTHALMIC

## 2023-10-16 MED ORDER — MOXIFLOXACIN HCL 0.5 % OP SOLN
OPHTHALMIC | Status: DC | PRN
  Administered 2023-10-16: .2 mL via OPHTHALMIC

## 2023-10-16 MED ORDER — ARMC OPHTHALMIC DILATING DROPS
OPHTHALMIC | Status: AC
  Filled 2023-10-16: qty 0.5

## 2023-10-16 MED ORDER — TETRACAINE HCL 0.5 % OP SOLN
1.0000 [drp] | OPHTHALMIC | Status: DC | PRN
Start: 2023-10-16 — End: 2023-10-16
  Administered 2023-10-16 (×3): 1 [drp] via OPHTHALMIC

## 2023-10-16 MED ORDER — SIGHTPATH DOSE#1 NA HYALUR & NA CHOND-NA HYALUR IO KIT
PACK | INTRAOCULAR | Status: DC | PRN
  Administered 2023-10-16: 1 via OPHTHALMIC

## 2023-10-16 MED ORDER — FENTANYL CITRATE (PF) 100 MCG/2ML IJ SOLN
INTRAMUSCULAR | Status: DC | PRN
  Administered 2023-10-16 (×2): 50 ug via INTRAVENOUS

## 2023-10-16 MED ORDER — FENTANYL CITRATE (PF) 100 MCG/2ML IJ SOLN
INTRAMUSCULAR | Status: AC
  Filled 2023-10-16: qty 2

## 2023-10-16 MED ORDER — MIDAZOLAM HCL 2 MG/2ML IJ SOLN
INTRAMUSCULAR | Status: AC
  Filled 2023-10-16: qty 2

## 2023-10-16 MED ORDER — SIGHTPATH DOSE#1 BSS IO SOLN
INTRAOCULAR | Status: DC | PRN
  Administered 2023-10-16: 15 mL via INTRAOCULAR

## 2023-10-16 SURGICAL SUPPLY — 10 items
CATARACT SUITE SIGHTPATH (MISCELLANEOUS) ×1
DISSECTOR HYDRO NUCLEUS 50X22 (MISCELLANEOUS) ×1 IMPLANT
FEE CATARACT SUITE SIGHTPATH (MISCELLANEOUS) ×1 IMPLANT
GLOVE PI ULTRA LF STRL 7.5 (GLOVE) ×1 IMPLANT
GLOVE SURG POLYISOPRENE 8.5 (GLOVE) ×1
GLOVE SURG SYN 8.5 PF PI BL (GLOVE) ×1 IMPLANT
LENS IOL TECNIS EYHANCE 22.0 (Intraocular Lens) IMPLANT
NDL FILTER BLUNT 18X1 1/2 (NEEDLE) ×1 IMPLANT
NEEDLE FILTER BLUNT 18X1 1/2 (NEEDLE) ×1
SYR 3ML LL SCALE MARK (SYRINGE) ×1 IMPLANT

## 2023-10-16 NOTE — Transfer of Care (Signed)
Immediate Anesthesia Transfer of Care Note  Patient: Christina Roy  Procedure(s) Performed: CATARACT EXTRACTION PHACO AND INTRAOCULAR LENS PLACEMENT (IOC) RIGHT 6.95 00:37.6 (Right)  Patient Location: PACU  Anesthesia Type: MAC  Level of Consciousness: awake, alert  and patient cooperative  Airway and Oxygen Therapy: Patient Spontanous Breathing and Patient connected to supplemental oxygen  Post-op Assessment: Post-op Vital signs reviewed, Patient's Cardiovascular Status Stable, Respiratory Function Stable, Patent Airway and No signs of Nausea or vomiting  Post-op Vital Signs: Reviewed and stable  Complications: No notable events documented.

## 2023-10-16 NOTE — Op Note (Signed)
OPERATIVE NOTE  Christina Roy [id number] 10/16/2023   PREOPERATIVE DIAGNOSIS:  Nuclear sclerotic cataract right eye.  H25.11   POSTOPERATIVE DIAGNOSIS:    Nuclear sclerotic cataract right eye.     PROCEDURE:  Phacoemusification with posterior chamber intraocular lens placement of the right eye   LENS:   Implant Name Type Inv. Item Serial No. Manufacturer Lot No. LRB No. Used Action  LENS IOL TECNIS EYHANCE 22.0 - [id number] Intraocular Lens LENS IOL TECNIS EYHANCE 22.0 [id number] [employer]  Right 1 Implanted       Procedure(s): CATARACT EXTRACTION PHACO AND INTRAOCULAR LENS PLACEMENT (IOC) RIGHT 6.95 00:37.6 (Right)  SURGEON:  Willey Blade, MD, MPH  ANESTHESIOLOGIST: Anesthesiologist: Marisue Humble, MD CRNA: Deland Pretty, CRNA   ANESTHESIA:  Topical with tetracaine drops augmented with 1% preservative-free intracameral lidocaine.  ESTIMATED BLOOD LOSS: less than 1 mL.   COMPLICATIONS:  None.   DESCRIPTION OF PROCEDURE:  The patient was identified in the holding room and transported to the operating room and placed in the supine position under the operating microscope.  The right eye was identified as the operative eye and it was prepped and draped in the usual sterile ophthalmic fashion.   A 1.0 millimeter clear-corneal paracentesis was made at the 10:30 position. 0.5 ml of preservative-free 1% lidocaine with epinephrine was injected into the anterior chamber.  The anterior chamber was filled with viscoelastic.  A 2.4 millimeter keratome was used to make a near-clear corneal incision at the 8:00 position.  A curvilinear capsulorrhexis was made with a cystotome and capsulorrhexis forceps.  Balanced salt solution was used to hydrodissect and hydrodelineate the nucleus.   Phacoemulsification was then used in stop and chop fashion to remove the lens nucleus and epinucleus.  The remaining cortex was then removed using the irrigation and aspiration handpiece. Viscoelastic  was then placed into the capsular bag to distend it for lens placement.  A lens was then injected into the capsular bag.  The remaining viscoelastic was aspirated.   Wounds were hydrated with balanced salt solution.  The anterior chamber was inflated to a physiologic pressure with balanced salt solution.   Intracameral vigamox 0.1 mL undiluted was injected into the eye and a drop placed onto the ocular surface.  No wound leaks were noted.  The patient was taken to the recovery room in stable condition without complications of anesthesia or surgery  Willey Blade 10/16/2023, 8:52 AM

## 2023-10-16 NOTE — Anesthesia Postprocedure Evaluation (Signed)
Anesthesia Post Note  Patient: Christina Roy  Procedure(s) Performed: CATARACT EXTRACTION PHACO AND INTRAOCULAR LENS PLACEMENT (IOC) RIGHT 6.95 00:37.6 (Right)  Patient location during evaluation: PACU Anesthesia Type: MAC Level of consciousness: awake and alert Pain management: pain level controlled Vital Signs Assessment: post-procedure vital signs reviewed and stable Respiratory status: spontaneous breathing, nonlabored ventilation, respiratory function stable and patient connected to nasal cannula oxygen Cardiovascular status: stable and blood pressure returned to baseline Postop Assessment: no apparent nausea or vomiting Anesthetic complications: no   No notable events documented.   Last Vitals:  Vitals:   10/16/23 0855 10/16/23 0858  BP: 130/63 133/62  Pulse: 63 67  Resp: 12 (!) 21  Temp:  (!) 36.3 C  SpO2: 98% 100%    Last Pain:  Vitals:   10/16/23 0858  TempSrc:   PainSc: 0-No pain                 Marisue Humble

## 2023-10-16 NOTE — H&P (Signed)
[facility]   Primary Care Physician:  Filbert Berthold, MD Ophthalmologist: Dr. Willey Blade  Pre-Procedure History & Physical: HPI:  Christina Roy is a 63 y.o. female here for cataract surgery.   Past Medical History:  Diagnosis Date   Anxiety    Arthritis    Chronic pain    Hepatitis    Hep B treated w/ Viread    Past Surgical History:  Procedure Laterality Date   ORIF ORBITAL FRACTURE Right 09/19/2018   Procedure: OPEN REDUCTION INTERNAL FIXATION (ORIF) ORBITAL BLOW OUT FRACTURE (TRANSCONJUNCTIVAL APPROACH);  Surgeon: Geanie Logan, MD;  Location: [facility] ORS;  Service: ENT;  Laterality: Right;    Prior to Admission medications   Medication Sig Start Date End Date Taking? Authorizing Provider  aspirin EC 81 MG tablet Take 81 mg by mouth daily. Swallow whole.   Yes [provider]  atorvastatin (LIPITOR) 20 MG tablet Take 20 mg by mouth daily.   Yes [provider]  escitalopram (LEXAPRO) 20 MG tablet Take 20 mg by mouth daily.  01/07/18  Yes [provider]  ibuprofen (ADVIL) 800 MG tablet Take 800 mg by mouth 2 (two) times daily as needed. 04/19/22  Yes [provider]  meclizine (ANTIVERT) 25 MG tablet Take 25 mg by mouth 3 (three) times daily as needed for dizziness.   Yes [provider]  omeprazole (PRILOSEC) 40 MG capsule Take 40 mg by mouth daily.   Yes [provider]  oxyCODONE (OXY IR/ROXICODONE) 5 MG immediate release tablet Take by mouth. 04/20/22  Yes [provider]  tenofovir (VIREAD) 300 MG tablet Take 300 mg by mouth daily.  01/08/18  Yes [provider]  buPROPion HCl (WELLBUTRIN PO) Take by mouth. Patient not taking: Reported on 09/28/2023    [provider]  doxycycline (VIBRAMYCIN) 100 MG capsule Take 1 capsule (100 mg total) by mouth 2 (two) times daily. Patient not taking: Reported on 09/28/2023 02/09/21   Delton See, MD  fluticasone [facility]) 50 MCG/ACT nasal spray  Place 2 sprays into both nostrils daily. Patient not taking: Reported on 09/28/2023 02/09/21   Delton See, MD  gabapentin (NEURONTIN) 100 MG capsule Take 2 capsules (200 mg total) by mouth 3 (three) times daily. Patient not taking: Reported on 09/28/2023 09/19/18   Geanie Logan, MD  hydrOXYzine (ATARAX/VISTARIL) 25 MG tablet Take 1 tablet (25 mg total) by mouth every 8 (eight) hours as needed for anxiety. Patient not taking: Reported on 09/28/2023 08/18/20   Tommie Sams, DO  methylPREDNISolone (MEDROL DOSEPAK) 4 MG TBPK tablet Take according to the package insert. Patient not taking: Reported on 09/28/2023 05/03/22   Becky Augusta, NP  omeprazole (PRILOSEC) 40 MG capsule Take 1 capsule by mouth daily. 10/15/21   [provider]  OXYCONTIN 15 MG 12 hr tablet Take 15 mg by mouth 2 (two) times daily.  Patient not taking: Reported on 09/28/2023 12/16/17   [provider]  famotidine (PEPCID) 20 MG tablet Take 20 mg by mouth 2 (two) times daily. 07/31/18 07/29/19  [provider]    Allergies as of 09/07/2023 - Review Complete 05/03/2022  Allergen Reaction Noted   Bupropion Other (See Comments) 01/28/2013   Demerol [meperidine hcl] Other (See Comments) 09/17/2018   Fentanyl Other (See Comments) 04/14/2014   Morphine Other (See Comments) 04/01/2014   Penicillins Nausea Only 09/17/2018   Trazodone Other (See Comments) 03/23/2016    Family History  Problem Relation Age of Onset   Thyroid disease  Mother    Other Father        unknown medical history   Breast cancer Neg Hx     Social History   Socioeconomic History   Marital status: Married    Spouse name: Not on file   Number of children: Not on file   Years of education: Not on file   Highest education level: Not on file  Occupational History   Not on file  Tobacco Use   Smoking status: Every Day    Current packs/day: 0.50    Types: Cigarettes   Smokeless tobacco: Never  Vaping Use   Vaping status: Never  Used  Substance and Sexual Activity   Alcohol use: Not Currently   Drug use: Never   Sexual activity: Not on file  Other Topics Concern   Not on file  Social History Narrative   Not on file   Social Drivers of Health   Financial Resource Strain: Low Risk  (09/08/2023)   Received [facility]   Overall Financial Resource Strain (CARDIA)    Difficulty of Paying Living Expenses: Not hard at all  Food Insecurity: Food Insecurity Present (09/08/2023)   Received from [facility]   Hunger Vital Sign    Ran Out of Food in the Last Year: Often true    Worried About Running Out of Food in the Last Year: Sometimes true  Transportation Needs: No Transportation Needs (09/08/2023)   Received from [facility]   PRAPARE - Transportation    Lack of Transportation (Non-Medical): No    In the past 12 months, has lack of transportation kept you from medical appointments or from getting medications?: No  Physical Activity: Not on file  Stress: Not on file  Social Connections: Not on file  Intimate Partner Violence: Not on file    Review of Systems: See HPI, otherwise negative ROS  Physical Exam: BP 123/74   Pulse 68   Temp (!) 97.4 F (36.3 C) (Temporal)   Resp 20   Ht 5\' 1"  (1.549 m)   Wt 85.7 kg   SpO2 100%   BMI 35.71 kg/m  General:   Alert, cooperative in NAD Head:  Normocephalic and atraumatic. Respiratory:  Normal work of breathing. Cardiovascular:  RRR  Impression/Plan: Christina Roy is here for cataract surgery.  Risks, benefits, limitations, and alternatives regarding cataract surgery have been reviewed with the patient.  Questions have been answered.  All parties agreeable.   Willey Blade, MD  10/16/2023, 8:21 AM

## 2023-10-17 ENCOUNTER — Other Ambulatory Visit: Payer: Self-pay

## 2023-10-17 ENCOUNTER — Encounter: Payer: Self-pay | Admitting: Ophthalmology

## 2023-10-23 NOTE — Anesthesia Preprocedure Evaluation (Addendum)
 Anesthesia Evaluation  Patient identified by MRN, date of birth, ID band Patient awake    Reviewed: Allergy & Precautions, H&P , NPO status , Patient's Chart, lab work & pertinent test results  Airway Mallampati: II  TM Distance: >3 FB Neck ROM: Full    Dental no notable dental hx.    Pulmonary Current Smoker   Pulmonary exam normal breath sounds clear to auscultation       Cardiovascular negative cardio ROS Normal cardiovascular exam Rhythm:Regular Rate:Normal     Neuro/Psych   Anxiety     negative neurological ROS  negative psych ROS   GI/Hepatic negative GI ROS, Neg liver ROS,,,(+) Hepatitis -  Endo/Other  negative endocrine ROS    Renal/GU negative Renal ROS  negative genitourinary   Musculoskeletal negative musculoskeletal ROS (+) Arthritis ,    Abdominal   Peds negative pediatric ROS (+)  Hematology negative hematology ROS (+)   Anesthesia Other Findings Previous cataract surgery 10-16-23 Dr. Juel Burrow anesthesiologist patient had versed 2 mg IV and fentanyl 100 mcg IV  Hepatitis  Arthritis Anxiety  Chronic pain    Reproductive/Obstetrics negative OB ROS                              Anesthesia Physical Anesthesia Plan  ASA: 2  Anesthesia Plan: MAC   Post-op Pain Management:    Induction: Intravenous  PONV Risk Score and Plan:   Airway Management Planned: Natural Airway and Nasal Cannula  Additional Equipment:   Intra-op Plan:   Post-operative Plan:   Informed Consent: I have reviewed the patients History and Physical, chart, labs and discussed the procedure including the risks, benefits and alternatives for the proposed anesthesia with the patient or authorized representative who has indicated his/her understanding and acceptance.     Dental Advisory Given  Plan Discussed with: Anesthesiologist, CRNA and Surgeon  Anesthesia Plan Comments: (Patient consented  for risks of anesthesia including but not limited to:  - adverse reactions to medications - damage to eyes, teeth, lips or other oral mucosa - nerve damage due to positioning  - sore throat or hoarseness - Damage to heart, brain, nerves, lungs, other parts of body or loss of life  Patient voiced understanding and assent.)         Anesthesia Quick Evaluation

## 2023-11-03 NOTE — Discharge Instructions (Signed)

## 2023-11-06 ENCOUNTER — Encounter: Payer: Self-pay | Admitting: Ophthalmology

## 2023-11-06 ENCOUNTER — Encounter
Admission: RE | Disposition: A | Discharge status: Home / Self Care | Payer: Self-pay | Source: Home / Self Care | Attending: Ophthalmology

## 2023-11-06 ENCOUNTER — Ambulatory Visit: Payer: Self-pay | Admitting: Anesthesiology

## 2023-11-06 ENCOUNTER — Other Ambulatory Visit: Payer: Self-pay

## 2023-11-06 ENCOUNTER — Ambulatory Visit
Admission: RE | Admit: 2023-11-06 | Discharge: 2023-11-06 | Disposition: A | Discharge status: Home / Self Care | Payer: Medicare HMO | Attending: Ophthalmology | Admitting: Ophthalmology

## 2023-11-06 DIAGNOSIS — Z9841 Cataract extraction status, right eye: Secondary | ICD-10-CM | POA: Diagnosis not present

## 2023-11-06 DIAGNOSIS — H2512 Age-related nuclear cataract, left eye: Secondary | ICD-10-CM | POA: Insufficient documentation

## 2023-11-06 DIAGNOSIS — M1990 Unspecified osteoarthritis, unspecified site: Secondary | ICD-10-CM | POA: Insufficient documentation

## 2023-11-06 DIAGNOSIS — F419 Anxiety disorder, unspecified: Secondary | ICD-10-CM | POA: Insufficient documentation

## 2023-11-06 DIAGNOSIS — Z961 Presence of intraocular lens: Secondary | ICD-10-CM | POA: Diagnosis not present

## 2023-11-06 DIAGNOSIS — Z7982 Long term (current) use of aspirin: Secondary | ICD-10-CM | POA: Diagnosis not present

## 2023-11-06 DIAGNOSIS — F17210 Nicotine dependence, cigarettes, uncomplicated: Secondary | ICD-10-CM | POA: Insufficient documentation

## 2023-11-06 DIAGNOSIS — Z79899 Other long term (current) drug therapy: Secondary | ICD-10-CM | POA: Insufficient documentation

## 2023-11-06 HISTORY — PX: CATARACT EXTRACTION W/PHACO: SHX586

## 2023-11-06 SURGERY — PHACOEMULSIFICATION, CATARACT, WITH IOL INSERTION
Anesthesia: Monitor Anesthesia Care | Laterality: Left

## 2023-11-06 MED ORDER — TETRACAINE HCL 0.5 % OP SOLN
OPHTHALMIC | Status: AC
  Filled 2023-11-06: qty 4

## 2023-11-06 MED ORDER — FENTANYL CITRATE (PF) 100 MCG/2ML IJ SOLN
INTRAMUSCULAR | Status: AC
  Filled 2023-11-06: qty 2

## 2023-11-06 MED ORDER — ARMC OPHTHALMIC DILATING DROPS
1.0000 | OPHTHALMIC | Status: DC | PRN
  Administered 2023-11-06 (×3): 1 via OPHTHALMIC

## 2023-11-06 MED ORDER — ARMC OPHTHALMIC DILATING DROPS
OPHTHALMIC | Status: AC
Start: 2023-11-06 — End: ?
  Filled 2023-11-06: qty 0.5

## 2023-11-06 MED ORDER — SIGHTPATH DOSE#1 BSS IO SOLN
INTRAOCULAR | Status: DC | PRN
  Administered 2023-11-06: 15 mL via INTRAOCULAR

## 2023-11-06 MED ORDER — SIGHTPATH DOSE#1 BSS IO SOLN
INTRAOCULAR | Status: DC | PRN
  Administered 2023-11-06: 91 mL via OPHTHALMIC

## 2023-11-06 MED ORDER — MIDAZOLAM HCL 2 MG/2ML IJ SOLN
INTRAMUSCULAR | Status: AC
  Filled 2023-11-06: qty 2

## 2023-11-06 MED ORDER — MOXIFLOXACIN HCL 0.5 % OP SOLN
OPHTHALMIC | Status: DC | PRN
  Administered 2023-11-06: .2 mL via OPHTHALMIC

## 2023-11-06 MED ORDER — SIGHTPATH DOSE#1 NA HYALUR & NA CHOND-NA HYALUR IO KIT
PACK | INTRAOCULAR | Status: DC | PRN
  Administered 2023-11-06: 1 via OPHTHALMIC

## 2023-11-06 MED ORDER — FENTANYL CITRATE (PF) 100 MCG/2ML IJ SOLN
INTRAMUSCULAR | Status: DC | PRN
  Administered 2023-11-06 (×2): 50 ug via INTRAVENOUS

## 2023-11-06 MED ORDER — LIDOCAINE HCL (PF) 2 % IJ SOLN
INTRAOCULAR | Status: DC | PRN
  Administered 2023-11-06: 1 mL via INTRAOCULAR

## 2023-11-06 MED ORDER — MIDAZOLAM HCL 2 MG/2ML IJ SOLN
INTRAMUSCULAR | Status: DC | PRN
  Administered 2023-11-06: 2 mg via INTRAVENOUS

## 2023-11-06 MED ORDER — TETRACAINE HCL 0.5 % OP SOLN
1.0000 [drp] | OPHTHALMIC | Status: DC | PRN
Start: 2023-11-06 — End: 2023-11-06
  Administered 2023-11-06 (×3): 1 [drp] via OPHTHALMIC

## 2023-11-06 SURGICAL SUPPLY — 12 items
CATARACT SUITE SIGHTPATH (MISCELLANEOUS) ×1 IMPLANT
DISSECTOR HYDRO NUCLEUS 50X22 (MISCELLANEOUS) ×1 IMPLANT
FEE CATARACT SUITE SIGHTPATH (MISCELLANEOUS) ×1 IMPLANT
GLOVE PI ULTRA LF STRL 7.5 (GLOVE) ×1 IMPLANT
GLOVE SURG POLYISOPRENE 8.5 (GLOVE) ×1 IMPLANT
GLOVE SURG PROTEXIS BL SZ6.5 (GLOVE) ×1 IMPLANT
GLOVE SURG SYN 6.5 PF PI BL (GLOVE) ×1 IMPLANT
GLOVE SURG SYN 8.5 PF PI BL (GLOVE) ×1 IMPLANT
LENS IOL TECNIS EYHANCE 22.0 (Intraocular Lens) IMPLANT
NDL FILTER BLUNT 18X1 1/2 (NEEDLE) ×1 IMPLANT
NEEDLE FILTER BLUNT 18X1 1/2 (NEEDLE) ×1 IMPLANT
SYR 3ML LL SCALE MARK (SYRINGE) ×1 IMPLANT

## 2023-11-06 NOTE — H&P (Signed)
 [facility]   Primary Care Physician:  Filbert Berthold, MD Ophthalmologist: Dr. Willey Blade  Pre-Procedure History & Physical: HPI:  Christina Roy is a 63 y.o. female here for cataract surgery.   Past Medical History:  Diagnosis Date   Anxiety    Arthritis    Chronic pain    Hepatitis    Hep B treated w/ Viread    Past Surgical History:  Procedure Laterality Date   CATARACT EXTRACTION W/PHACO Right 10/16/2023   Procedure: CATARACT EXTRACTION PHACO AND INTRAOCULAR LENS PLACEMENT (IOC) RIGHT 6.95 00:37.6;  Surgeon: Nevada Crane, MD;  Location: [facility];  Service: Ophthalmology;  Laterality: Right;   ORIF ORBITAL FRACTURE Right 09/19/2018   Procedure: OPEN REDUCTION INTERNAL FIXATION (ORIF) ORBITAL BLOW OUT FRACTURE (TRANSCONJUNCTIVAL APPROACH);  Surgeon: Geanie Logan, MD;  Location: [facility];  Service: ENT;  Laterality: Right;    Prior to Admission medications   Medication Sig Start Date End Date Taking? Authorizing Provider  aspirin EC 81 MG tablet Take 81 mg by mouth daily. Swallow whole.   Yes [provider]  atorvastatin (LIPITOR) 20 MG tablet Take 20 mg by mouth daily.   Yes [provider]  escitalopram (LEXAPRO) 20 MG tablet Take 20 mg by mouth daily.  01/07/18  Yes [provider]  meclizine (ANTIVERT) 25 MG tablet Take 25 mg by mouth 3 (three) times daily as needed for dizziness.   Yes [provider]  omeprazole (PRILOSEC) 40 MG capsule Take 40 mg by mouth daily.   Yes [provider]  oxyCODONE (OXY IR/ROXICODONE) 5 MG immediate release tablet Take by mouth. 04/20/22  Yes [provider]  buPROPion HCl (WELLBUTRIN PO) Take by mouth. Patient not taking: Reported on 09/28/2023    [provider]  doxycycline (VIBRAMYCIN) 100 MG capsule Take 1 capsule (100 mg total) by mouth 2 (two) times daily. Patient not taking: Reported on 09/28/2023 02/09/21   Delton See, MD  fluticasone  [facility]) 50 MCG/ACT nasal spray Place 2 sprays into both nostrils daily. Patient not taking: Reported on 09/28/2023 02/09/21   Delton See, MD  gabapentin (NEURONTIN) 100 MG capsule Take 2 capsules (200 mg total) by mouth 3 (three) times daily. Patient not taking: Reported on 09/28/2023 09/19/18   Geanie Logan, MD  hydrOXYzine (ATARAX/VISTARIL) 25 MG tablet Take 1 tablet (25 mg total) by mouth every 8 (eight) hours as needed for anxiety. Patient not taking: Reported on 09/28/2023 08/18/20   Tommie Sams, DO  ibuprofen (ADVIL) 800 MG tablet Take 800 mg by mouth 2 (two) times daily as needed. 04/19/22   [provider]  methylPREDNISolone (MEDROL DOSEPAK) 4 MG TBPK tablet Take according to the package insert. Patient not taking: Reported on 09/28/2023 05/03/22   Becky Augusta, NP  OXYCONTIN 15 MG 12 hr tablet Take 15 mg by mouth 2 (two) times daily.  Patient not taking: Reported on 09/28/2023 12/16/17   [provider]  tenofovir (VIREAD) 300 MG tablet Take 300 mg by mouth daily.  01/08/18   [provider]  famotidine (PEPCID) 20 MG tablet Take 20 mg by mouth 2 (two) times daily. 07/31/18 07/29/19  [provider]    Allergies as of 09/07/2023 - Review Complete 05/03/2022  Allergen Reaction Noted   Bupropion Other (See Comments) 01/28/2013   Demerol [meperidine hcl] Other (See Comments) 09/17/2018   Fentanyl Other (See Comments) 04/14/2014   Morphine Other (See Comments) 04/01/2014   Penicillins Nausea Only 09/17/2018  Trazodone Other (See Comments) 03/23/2016    Family History  Problem Relation Age of Onset   Thyroid disease Mother    Other Father        unknown medical history   Breast cancer Neg Hx     Social History   Socioeconomic History   Marital status: Married    Spouse name: Not on file   Number of children: Not on file   Years of education: Not on file   Highest education level: Not on file  Occupational History   Not on file  Tobacco  Use   Smoking status: Every Day    Current packs/day: 0.50    Types: Cigarettes   Smokeless tobacco: Never  Vaping Use   Vaping status: Never Used  Substance and Sexual Activity   Alcohol use: Not Currently   Drug use: Never   Sexual activity: Not on file  Other Topics Concern   Not on file  Social History Narrative   Not on file   Social Drivers of Health   Financial Resource Strain: Low Risk  (09/08/2023)   Received from [facility]   Overall Financial Resource Strain (CARDIA)    Difficulty of Paying Living Expenses: Not hard at all  Food Insecurity: Food Insecurity Present (09/08/2023)   Received from [facility]   Hunger Vital Sign    Ran Out of Food in the Last Year: Often true    Worried About Running Out of Food in the Last Year: Sometimes true  Transportation Needs: No Transportation Needs (09/08/2023)   Received from [facility]   PRAPARE - Transportation    Lack of Transportation (Non-Medical): No    In the past 12 months, has lack of transportation kept you from medical appointments or from getting medications?: No  Physical Activity: Not on file  Stress: Not on file  Social Connections: Not on file  Intimate Partner Violence: Not on file    Review of Systems: See HPI, otherwise negative ROS  Physical Exam: BP (!) 158/72   Temp (!) 97.4 F (36.3 C) (Temporal)   Resp 17   Ht 5' 0.98" (1.549 m)   Wt 85.2 kg   SpO2 98%   BMI 35.52 kg/m  General:   Alert, cooperative in NAD Head:  Normocephalic and atraumatic. Respiratory:  Normal work of breathing. Cardiovascular:  RRR  Impression/Plan: Christina Roy is here for cataract surgery.  Risks, benefits, limitations, and alternatives regarding cataract surgery have been reviewed with the patient.  Questions have been answered.  All parties agreeable.   Willey Blade, MD  11/06/2023, 8:38 AM

## 2023-11-06 NOTE — Transfer of Care (Signed)
 Immediate Anesthesia Transfer of Care Note  Patient: Christina Roy  Procedure(s) Performed: CATARACT EXTRACTION PHACO AND INTRAOCULAR LENS PLACEMENT (IOC) LEFT 6.27, 00:39.3 (Left)  Patient Location: PACU  Anesthesia Type:MAC  Level of Consciousness: awake, alert , and oriented  Airway & Oxygen Therapy: Patient Spontanous Breathing  Post-op Assessment: Report given to RN  Post vital signs: Reviewed  Last Vitals:  Vitals Value Taken Time  BP    Temp    Pulse    Resp    SpO2      Last Pain:  Vitals:   11/06/23 0742  TempSrc: Temporal  PainSc: 0-No pain         Complications: No notable events documented.

## 2023-11-06 NOTE — Op Note (Signed)
 OPERATIVE NOTE  Christina Roy [id number] 11/06/2023   PREOPERATIVE DIAGNOSIS:  Nuclear sclerotic cataract left eye.  H25.12   POSTOPERATIVE DIAGNOSIS:    Nuclear sclerotic cataract left eye.     PROCEDURE:  Phacoemusification with posterior chamber intraocular lens placement of the left eye   LENS:   Implant Name Type Inv. Item Serial No. Manufacturer Lot No. LRB No. Used Action  LENS IOL TECNIS EYHANCE 22.0 - [id number] Intraocular Lens LENS IOL TECNIS EYHANCE 22.0 [id number] [employer]  Left 1 Implanted      Procedure(s): CATARACT EXTRACTION PHACO AND INTRAOCULAR LENS PLACEMENT (IOC) LEFT 6.27, 00:39.3 (Left)  SURGEON:  Willey Blade, MD, MPH   ANESTHESIA:  Topical with tetracaine drops augmented with 1% preservative-free intracameral lidocaine.  ESTIMATED BLOOD LOSS: <1 mL   COMPLICATIONS:  None.   DESCRIPTION OF PROCEDURE:  The patient was identified in the holding room and transported to the operating room and placed in the supine position under the operating microscope.  The left eye was identified as the operative eye and it was prepped and draped in the usual sterile ophthalmic fashion.   A 1.0 millimeter clear-corneal paracentesis was made at the 5:00 position. 0.5 ml of preservative-free 1% lidocaine with epinephrine was injected into the anterior chamber.  The anterior chamber was filled with viscoelastic.  A 2.4 millimeter keratome was used to make a near-clear corneal incision at the 2:00 position.  A curvilinear capsulorrhexis was made with a cystotome and capsulorrhexis forceps.  Balanced salt solution was used to hydrodissect and hydrodelineate the nucleus.   Phacoemulsification was then used in stop and chop fashion to remove the lens nucleus and epinucleus.  The remaining cortex was then removed using the irrigation and aspiration handpiece. Viscoelastic was then placed into the capsular bag to distend it for lens placement.  A lens was then injected into the  capsular bag.  The remaining viscoelastic was aspirated.   Wounds were hydrated with balanced salt solution.  The anterior chamber was inflated to a physiologic pressure with balanced salt solution.  Intracameral vigamox 0.1 mL undiltued was injected into the eye and a drop placed onto the ocular surface. No wound leaks were noted.  The patient was taken to the recovery room in stable condition without complications of anesthesia or surgery  Willey Blade 11/06/2023, 9:02 AM

## 2023-11-06 NOTE — Anesthesia Postprocedure Evaluation (Signed)
 Anesthesia Post Note  Patient: Christina Roy  Procedure(s) Performed: CATARACT EXTRACTION PHACO AND INTRAOCULAR LENS PLACEMENT (IOC) LEFT 6.27, 00:39.3 (Left)  Patient location during evaluation: PACU Anesthesia Type: MAC Level of consciousness: awake and alert Pain management: pain level controlled Vital Signs Assessment: post-procedure vital signs reviewed and stable Respiratory status: spontaneous breathing, nonlabored ventilation, respiratory function stable and patient connected to nasal cannula oxygen Cardiovascular status: stable and blood pressure returned to baseline Postop Assessment: no apparent nausea or vomiting Anesthetic complications: no   No notable events documented.   Last Vitals:  Vitals:   11/06/23 0904 11/06/23 0909  BP: 126/64 131/64  Pulse: 64 65  Resp: 14 12  Temp: (!) 36.1 C (!) 36.1 C  SpO2: 98% 100%    Last Pain:  Vitals:   11/06/23 0909  TempSrc:   PainSc: 0-No pain                 Marisue Humble

## 2023-11-07 ENCOUNTER — Encounter: Payer: Self-pay | Admitting: Ophthalmology

## 2024-04-10 ENCOUNTER — Encounter: Payer: Self-pay | Admitting: Emergency Medicine

## 2024-04-10 ENCOUNTER — Ambulatory Visit
Admission: EM | Admit: 2024-04-10 | Discharge: 2024-04-10 | Disposition: A | Discharge status: Home / Self Care | Attending: Family Medicine | Admitting: Family Medicine

## 2024-04-10 DIAGNOSIS — F339 Major depressive disorder, recurrent, unspecified: Secondary | ICD-10-CM

## 2024-04-10 DIAGNOSIS — Z634 Disappearance and death of family member: Secondary | ICD-10-CM

## 2024-04-10 DIAGNOSIS — F411 Generalized anxiety disorder: Secondary | ICD-10-CM

## 2024-04-10 DIAGNOSIS — F4321 Adjustment disorder with depressed mood: Secondary | ICD-10-CM

## 2024-04-10 DIAGNOSIS — Z8659 Personal history of other mental and behavioral disorders: Secondary | ICD-10-CM

## 2024-04-10 MED ORDER — ESCITALOPRAM OXALATE 10 MG PO TABS: ORAL_TABLET | ORAL | 0 refills | Status: AC

## 2024-04-10 NOTE — ED Provider Notes (Signed)
 [facility]    CSN: [id number] Arrival date & time: 04/10/24  1252      History   Chief Complaint No chief complaint on file.   HPI Christina Roy is a 63 y.o. female.   HPI   Christina Roy presents for concern for anti-depressant withdrawal. She thought her Lexapro  stopped working after the loss of her daughter. She thought she needed something new. She saw her PCP in June who switched her from Lexapro  to Venlafaxine.  This past week has been short-tempered and 2 point angry.  She has to raise her grand-daughter now.  She has to learn to have patience with her grand-daughter. She threw away her Venlafaxine 4-5 days ago.  I don't want that medicine.   The other day, I was laying there in my stupor thinking about all the ways I can get over this. She ended up throwing her phone on TV.  I was soooooo MAD.  She had passive thoughts of dying but no plan. I can't do that to my family and I love life.  Denies current HI or SI.    Has been having mood swings, sweats, shaking, lightheadedness. No chest pain, shortness of breath, nausea or vomiting.     Past Medical History:  Diagnosis Date   Anxiety    Arthritis    Chronic pain    Hepatitis    Hep B treated w/ Viread    There are no active problems to display for this patient.   Past Surgical History:  Procedure Laterality Date   CATARACT EXTRACTION W/PHACO Right 10/16/2023   Procedure: CATARACT EXTRACTION PHACO AND INTRAOCULAR LENS PLACEMENT (IOC) RIGHT 6.95 00:37.6;  Surgeon: Myrna Adine Anes, MD;  Location: [facility];  Service: Ophthalmology;  Laterality: Right;   CATARACT EXTRACTION W/PHACO Left 11/06/2023   Procedure: CATARACT EXTRACTION PHACO AND INTRAOCULAR LENS PLACEMENT (IOC) LEFT 6.27, 00:39.3;  Surgeon: Myrna Adine Anes, MD;  Location: [facility];  Service: Ophthalmology;  Laterality: Left;   ORIF ORBITAL FRACTURE Right 09/19/2018   Procedure: OPEN REDUCTION INTERNAL FIXATION  (ORIF) ORBITAL BLOW OUT FRACTURE (TRANSCONJUNCTIVAL APPROACH);  Surgeon: Blair Mt, MD;  Location: [facility];  Service: ENT;  Laterality: Right;    OB History   No obstetric history on file.      Home Medications    Prior to Admission medications   Medication Sig Start Date End Date Taking? Authorizing Provider  escitalopram  (LEXAPRO ) 10 MG tablet Take 1 tablet (10 mg total) by mouth daily for 14 days, THEN 2 tablets (20 mg total) daily for 16 days. 04/10/24 05/10/24 Yes Jazzman Loughmiller, DO  losartan (COZAAR) 100 MG tablet TAKE ONE (1) TABLET (100 MG TOTAL) BY MOUTH ONCE DAILY 11/07/23  Yes [provider]  amphetamine-dextroamphetamine (ADDERALL) 30 MG tablet Take 1 tablet by mouth daily.    [provider]  aspirin EC 81 MG tablet Take 81 mg by mouth daily. Swallow whole.    [provider]  atorvastatin (LIPITOR) 20 MG tablet Take 20 mg by mouth daily.    [provider]  buPROPion HCl (WELLBUTRIN PO) Take by mouth. Patient not taking: Reported on 09/28/2023    [provider]  doxycycline  (VIBRAMYCIN ) 100 MG capsule Take 1 capsule (100 mg total) by mouth 2 (two) times daily. Patient not taking: Reported on 09/28/2023 02/09/21   Gwenn Kent, MD  fluticasone  (FLONASE ) 50 MCG/ACT nasal spray Place 2 sprays into both nostrils daily. Patient not taking: Reported on 09/28/2023 02/09/21  Gwenn Kent, MD  gabapentin  (NEURONTIN ) 100 MG capsule Take 2 capsules (200 mg total) by mouth 3 (three) times daily. Patient not taking: Reported on 09/28/2023 09/19/18   Blair Mt, MD  hydrOXYzine  (ATARAX /VISTARIL ) 25 MG tablet Take 1 tablet (25 mg total) by mouth every 8 (eight) hours as needed for anxiety. Patient not taking: Reported on 09/28/2023 08/18/20   Cook, Jayce G, DO  ibuprofen (ADVIL) 800 MG tablet Take 800 mg by mouth 2 (two) times daily as needed. 04/19/22   [provider]  meclizine (ANTIVERT) 25 MG tablet Take 25 mg by mouth 3  (three) times daily as needed for dizziness.    [provider]  methylPREDNISolone  (MEDROL  DOSEPAK) 4 MG TBPK tablet Take according to the package insert. Patient not taking: Reported on 09/28/2023 05/03/22   Bernardino Ditch, NP  omeprazole (PRILOSEC) 40 MG capsule Take 40 mg by mouth daily.    [provider]  oxyCODONE  (OXY IR/ROXICODONE ) 5 MG immediate release tablet Take by mouth. 04/20/22   [provider]  OXYCONTIN  15 MG 12 hr tablet Take 15 mg by mouth 2 (two) times daily.  Patient not taking: Reported on 09/28/2023 12/16/17   [provider]  tenofovir (VIREAD) 300 MG tablet Take 300 mg by mouth daily.  01/08/18   [provider]  famotidine  (PEPCID ) 20 MG tablet Take 20 mg by mouth 2 (two) times daily. 07/31/18 07/29/19  [provider]    Family History Family History  Problem Relation Age of Onset   Thyroid disease Mother    Other Father        unknown medical history   Breast cancer Neg Hx     Social History Social History   Tobacco Use   Smoking status: Every Day    Current packs/day: 0.50    Types: Cigarettes   Smokeless tobacco: Never  Vaping Use   Vaping status: Never Used  Substance Use Topics   Alcohol use: Not Currently   Drug use: Never     Allergies   Bupropion, Demerol [meperidine hcl], Fentanyl , Morphine, Penicillins, and Trazodone   Review of Systems Review of Systems: negative unless otherwise stated in HPI.      Physical Exam Triage Vital Signs ED Triage Vitals  Encounter Vitals Group     BP 04/10/24 1322 (!) 153/86     Girls Systolic BP Percentile --      Girls Diastolic BP Percentile --      Boys Systolic BP Percentile --      Boys Diastolic BP Percentile --      Pulse Rate 04/10/24 1322 89     Resp 04/10/24 1322 19     Temp 04/10/24 1322 98.2 F (36.8 C)     Temp Source 04/10/24 1322 Oral     SpO2 04/10/24 1322 100 %     Weight 04/10/24 1319 185 lb (83.9 kg)     Height --      Head  Circumference --      Peak Flow --      Pain Score 04/10/24 1319 0     Pain Loc --      Pain Education --      Exclude from Growth Chart --    No data found.  Updated Vital Signs BP (!) 153/86 (BP Location: Left Arm)   Pulse 89   Temp 98.2 F (36.8 C) (Oral)   Resp 19   Wt 83.9 kg   SpO2 100%  BMI 34.97 kg/m   Visual Acuity Right Eye Distance:   Left Eye Distance:   Bilateral Distance:    Right Eye Near:   Left Eye Near:    Bilateral Near:     Physical Exam GEN:     alert, tearful and no distress    HENT:  mucus membranes moist, no nasal discharge  EYES:   EOM intact RESP:  clear to auscultation bilaterally, no increased work of breathing  CVS:   regular rate and rhythm NEURO:  normal without focal findings,  speech normal, alert and oriented   Skin:   warm and dry Psych:  Cognition and judgment appear intact. Alert, communicative  and cooperative with normal attention span and concentration. No apparent delusions, illusions, hallucinations, tearful     UC Treatments / Results  Labs (all labs ordered are listed, but only abnormal results are displayed) Labs Reviewed - No data to display  EKG  If EKG performed, see my interpretation in the MDM section  Radiology No results found.   Procedures Procedures (including critical care time)  Medications Ordered in UC Medications - No data to display  Initial Impression / Assessment and Plan / UC Course  I have reviewed the triage vital signs and the nursing notes.  Pertinent labs & imaging results that were available during my care of the patient were reviewed by me and considered in my medical decision making (see chart for details).       Patient is a 63 y.o. female  who presents for medication withdrawal symptoms.  Overall patient is nontoxic-appearing and afebrile.  She is hypertensive. Cardiopulmonary exam is unremarkable. Pt is tearful and trembling. She has significant grief when it comes to her  the death of her daughter 1-2 months ago and the death of her son. She has been short-tempered recently. She notes that she had passive suicidal ideations but her family is a protective factor. She has no plan to harm herself.   Per chart review, patient has history of generalized anxiety disorder and major depressive disorder.  She was previously on Lexapro  20 mg but thought this was not working anymore after the death of her daughter as she was doing a lot of crying and her mood was unstable.  She went to her primary care office and was switched to venlafaxine.  She got angry about a week ago and threw this medication away as she felt like this is the cause of her current symptoms.  Since then, she has had increased crying, intermittent dizziness, nausea, sweats, fatigue and anxiety.  Discussed that venlafaxine typically is tapered off and offered to restart this medication on a taper schedule switch her discontinuation symptoms or less.  She declines and states she does not want to restart this medication.  She is interested in restarting her Lexapro .  Will restart Lexapro  at 10 mg and gradually work her way up to 20 mg.  She is to follow-up with her primary care provider in the next 1-2 weeks.  We discussed at length grief and benefits of grief counseling.  States she doesn't have time to grieve because she has to be a mother to her grand-daughter.  States that it made it worse when her son died and she went through grief counseling.   ED and return precautions given and patient/guardian voiced understanding. Discussed MDM, treatment plan and plan for follow-up with patient who agrees with plan.    Time based billing statement Performed by: Caprice Porteous, DO  Total time: 51 minutes  Time was exclusive of separately billable procedures and treating other patients.   Time spent personally by me on the following activities: reviewing records, development of treatment plan with patient and/or  surrogate, evaluation of patient's response to treatment, examination of patient, obtaining history from patient or surrogate, ordering and performing treatments and interventions outside of separately billable procedures, ordering and review of laboratory studies, ordering and review of radiographic studies, pulse oximetry, coordinating care, counseling patient on grief, depression and anxiety and re-evaluation of patient's condition. .   Final Clinical Impressions(s) / UC Diagnoses   Final diagnoses:  Grief at loss of child  History of recent stressful life event  GAD (generalized anxiety disorder)  Episode of recurrent major depressive disorder, unspecified depression episode severity (HCC)     Discharge Instructions      We discussed alternating the Venlafaxine for the next 2 weeks  which could help with the withdrawal symptoms. Withdrawal symptoms can last 3-4 weeks without tapering off the medication. You want to not take this medication any longer.    Restart Lexapro  at 10 mg for the next 14 days then increase to 20 mg after 2 weeks.  Schedule a follow up appointment with your primary care provider soon.    Reconsider grief counseling      ED Prescriptions     Medication Sig Dispense Auth. Provider   escitalopram  (LEXAPRO ) 10 MG tablet Take 1 tablet (10 mg total) by mouth daily for 14 days, THEN 2 tablets (20 mg total) daily for 16 days. 46 tablet Mayjor Ager, DO      PDMP not reviewed this encounter.   Terald Jump, DO 04/11/24 1449

## 2024-04-10 NOTE — ED Triage Notes (Addendum)
 Pt states she change antidepressants about the 3rd week in 03/18/2024 due to the death of her daughter. She thought the change would help her cope. She switched from Lexapro  to Effexor. She decided to stop taking the Effexor 4 days ago because it was causing her to be in a bad mood. Yesterday she started to have sweats, dizziness, and shakes, she feels like she wants to hyperventilate and she hears a swooshing sound in her head.

## 2024-04-10 NOTE — Discharge Instructions (Addendum)
 We discussed alternating the Venlafaxine for the next 2 weeks  which could help with the withdrawal symptoms. Withdrawal symptoms can last 3-4 weeks without tapering off the medication. You want to not take this medication any longer.    Restart Lexapro  at 10 mg for the next 14 days then increase to 20 mg after 2 weeks.  Schedule a follow up appointment with your primary care provider soon.    Reconsider grief counseling

## 2024-04-15 ENCOUNTER — Other Ambulatory Visit: Payer: Self-pay | Admitting: Unknown Physician Specialty

## 2024-04-15 DIAGNOSIS — Z1231 Encounter for screening mammogram for malignant neoplasm of breast: Secondary | ICD-10-CM

## 2024-05-27 ENCOUNTER — Ambulatory Visit
Admission: RE | Admit: 2024-05-27 | Discharge: 2024-05-27 | Disposition: A | Discharge status: Home / Self Care | Source: Ambulatory Visit | Attending: Unknown Physician Specialty | Admitting: Unknown Physician Specialty

## 2024-05-27 DIAGNOSIS — Z1231 Encounter for screening mammogram for malignant neoplasm of breast: Secondary | ICD-10-CM | POA: Insufficient documentation
